# Patient Record
Sex: Female | Born: 1975 | Race: White | Hispanic: No | Marital: Married | Smoking: Former smoker
Health system: Southern US, Community
[De-identification: ages and names within clinical notes are randomized; demographics above are authoritative.]

## PROBLEM LIST (undated history)

## (undated) DIAGNOSIS — G43909 Migraine, unspecified, not intractable, without status migrainosus: Secondary | ICD-10-CM

## (undated) DIAGNOSIS — J309 Allergic rhinitis, unspecified: Secondary | ICD-10-CM

## (undated) DIAGNOSIS — F419 Anxiety disorder, unspecified: Secondary | ICD-10-CM

## (undated) HISTORY — DX: Migraine, unspecified, not intractable, without status migrainosus: G43.909

## (undated) HISTORY — DX: Anxiety disorder, unspecified: F41.9

## (undated) HISTORY — DX: Allergic rhinitis, unspecified: J30.9

---

## 2004-12-05 ENCOUNTER — Ambulatory Visit: Payer: Self-pay | Admitting: Internal Medicine

## 2005-03-23 ENCOUNTER — Ambulatory Visit: Payer: Self-pay | Admitting: Internal Medicine

## 2005-06-05 ENCOUNTER — Emergency Department (HOSPITAL_COMMUNITY): Admission: EM | Admit: 2005-06-05 | Discharge: 2005-06-05 | Payer: Self-pay | Admitting: Emergency Medicine

## 2006-12-28 ENCOUNTER — Ambulatory Visit: Payer: Self-pay | Admitting: Internal Medicine

## 2007-05-09 ENCOUNTER — Ambulatory Visit: Payer: Self-pay | Admitting: Internal Medicine

## 2007-12-05 ENCOUNTER — Ambulatory Visit: Payer: Self-pay | Admitting: Internal Medicine

## 2007-12-05 DIAGNOSIS — J45998 Other asthma: Secondary | ICD-10-CM

## 2007-12-05 DIAGNOSIS — F6389 Other impulse disorders: Secondary | ICD-10-CM | POA: Insufficient documentation

## 2007-12-05 DIAGNOSIS — J302 Other seasonal allergic rhinitis: Secondary | ICD-10-CM

## 2007-12-05 DIAGNOSIS — J3089 Other allergic rhinitis: Secondary | ICD-10-CM

## 2008-01-09 ENCOUNTER — Encounter: Payer: Self-pay | Admitting: Internal Medicine

## 2008-02-27 ENCOUNTER — Ambulatory Visit: Payer: Self-pay | Admitting: Internal Medicine

## 2008-02-29 DIAGNOSIS — F411 Generalized anxiety disorder: Secondary | ICD-10-CM | POA: Insufficient documentation

## 2008-08-29 ENCOUNTER — Telehealth: Payer: Self-pay | Admitting: Internal Medicine

## 2008-09-01 ENCOUNTER — Telehealth (INDEPENDENT_AMBULATORY_CARE_PROVIDER_SITE_OTHER): Payer: Self-pay | Admitting: *Deleted

## 2009-02-25 ENCOUNTER — Ambulatory Visit: Payer: Self-pay | Admitting: Internal Medicine

## 2009-02-26 LAB — CONVERTED CEMR LAB
ALT: 15 units/L (ref 0–35)
AST: 20 units/L (ref 0–37)
Alkaline Phosphatase: 62 units/L (ref 39–117)
BUN: 13 mg/dL (ref 6–23)
Basophils Relative: 0.9 % (ref 0.0–3.0)
Bilirubin Urine: NEGATIVE
Bilirubin, Direct: 0.2 mg/dL (ref 0.0–0.3)
Cholesterol: 146 mg/dL (ref 0–200)
Eosinophils Relative: 6 % — ABNORMAL HIGH (ref 0.0–5.0)
GFR calc Af Amer: 108 mL/min
GFR calc non Af Amer: 90 mL/min
Glucose, Bld: 90 mg/dL (ref 70–99)
Ketones, ur: NEGATIVE mg/dL
LDL Cholesterol: 73 mg/dL (ref 0–99)
Leukocytes, UA: NEGATIVE
Lymphocytes Relative: 25.7 % (ref 12.0–46.0)
MCV: 95.4 fL (ref 78.0–100.0)
Monocytes Absolute: 0.6 10*3/uL (ref 0.1–1.0)
Neutro Abs: 5.3 10*3/uL (ref 1.4–7.7)
Nitrite: NEGATIVE
Platelets: 252 10*3/uL (ref 150–400)
RDW: 11.9 % (ref 11.5–14.6)
Sodium: 139 meq/L (ref 135–145)
Specific Gravity, Urine: >=1.03 (ref 1.000–1.035)
Total Bilirubin: 0.8 mg/dL (ref 0.3–1.2)
Total Protein: 7.1 g/dL (ref 6.0–8.3)
Urine Glucose: NEGATIVE mg/dL
pH: 5.5 (ref 5.0–8.0)

## 2009-05-20 ENCOUNTER — Telehealth: Payer: Self-pay | Admitting: Internal Medicine

## 2009-05-20 ENCOUNTER — Encounter: Payer: Self-pay | Admitting: Internal Medicine

## 2009-09-13 ENCOUNTER — Ambulatory Visit: Payer: Self-pay | Admitting: Internal Medicine

## 2009-09-13 DIAGNOSIS — J019 Acute sinusitis, unspecified: Secondary | ICD-10-CM

## 2009-12-02 ENCOUNTER — Ambulatory Visit: Payer: Self-pay | Admitting: Internal Medicine

## 2010-02-28 ENCOUNTER — Ambulatory Visit: Payer: Self-pay | Admitting: Internal Medicine

## 2010-02-28 DIAGNOSIS — J45909 Unspecified asthma, uncomplicated: Secondary | ICD-10-CM

## 2011-01-24 NOTE — Assessment & Plan Note (Signed)
Summary: FEVER,SORE THROAT,COUGH,CHEST CONGEST-STC   Vital Signs:  Patient profile:   35 year old female Height:      64 inches Weight:      158.50 pounds BMI:     27.30 O2 Sat:      98 % on Room air Temp:     98.7 degrees F oral Pulse rate:   95 / minute BP sitting:   100 / 70  (left arm) Cuff size:   regular  Vitals Entered ByZella Ball Ewing (February 28, 2010 1:28 PM)  O2 Flow:  Room air CC: Sore throat, cough, chest congestion/RE   Primary Care Provider:  Corwin Levins MD  CC:  Sore throat, cough, and chest congestion/RE.  History of Present Illness: here with 5 days acute onset fever, malaise, headache, myalgias, mild ST ;  also for last 2 days with increasing prod cough, greenish without blood, and marked increased wheezing, mild sob, hard to sleep last night, using proventil inh up to 6 times per day; Pt denies CP,  orthopnea, pnd, worsening LE edema, palps, dizziness or syncope .  No high fever or chills, rash or joint pains.  Asthma until recent has been well controlled on current meds, with only infreq inhaler use and no nighttime awaekneings.    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 1 By Mouth Once Daily 2)  Proventil Hfa 108 (90 Base) Mcg/act  Aers (Albuterol Sulfate) .... 2 Puffs Four Times Per Day As Needed 3)  Guaifenesin Dac 30-10-100 Mg/43ml Soln (Pseudoephedrine-Codeine-Gg) .... 5-10 Ml By Mouth Qid As Needed For Cough  Current Medications (verified): 1)  Singulair 10 Mg  Tabs (Montelukast Sodium) .Marland Kitchen.. 1 By Mouth Once Daily 2)  Proventil Hfa 108 (90 Base) Mcg/act  Aers  (Albuterol Sulfate) .... 2 Puffs Four Times Per Day As Needed 3)  Hydrocodone-Homatropine 5-1.5 Mg/62ml Syrp (Hydrocodone-Homatropine) .Marland Kitchen.. 1 Tsp By Mouth Q 6 Hrs As Needed Cough 4)  Azithromycin 250 Mg Tabs (Azithromycin) .... 2po Qd For 1 Day, Then 1po Qd For 4days, Then Stop 5)  Prednisone 10 Mg Tabs (Prednisone) .... 4po Qd For 3days, Then 3po Qd For 3days, Then 2po Qd For 3days, Then 1po Qd For 3 Days, Then Stop  Allergies (verified): 1)  ! Neosporin  Past History:  Past Medical History: Last updated: 02/27/2008 Asthma Allergic rhinitis migraine Anxiety  Past Surgical History: Last updated: 12/05/2007 s/p wrist surgury  Social History: Last updated: 12/02/2009 Current Smoker Alcohol use-yes Occupation: Physicist, medical Alcohol use-no Drug use-no Regular exercise-yes  Risk Factors: Alcohol Use: 0 (12/02/2009) Exercise: yes (12/02/2009)  Risk Factors: Smoking Status: current (12/02/2009) Packs/Day: 0.5 (12/02/2009)  Review of Systems       all otherwise negative per pt -   Physical Exam  General:  alert and overweight-appearing.  , mild ill  Head:  normocephalic and atraumatic.   Eyes:  vision grossly intact, pupils equal, and pupils round.   Ears:  bilat tm's  mild red, sinus nontender Nose:  nasal dischargemucosal pallor and mucosal erythema.   Mouth:  pharyngeal erythema and fair dentition.  Neck:  supple and cervical lymphadenopathy.   Lungs:  normal respiratory effort, R decreased breath sounds, R wheezes, L fremitus, and L wheezes.   Heart:  normal rate and regular rhythm.   Extremities:  no edema, no erythema  Skin:  color normal and no rashes.     Impression & Recommendations:  Problem # 1:  BRONCHITIS-ACUTE (ICD-466.0)  Her updated medication list for this problem includes:    Singulair 10 Mg Tabs (Montelukast sodium) .Marland Kitchen... 1 by mouth once daily    Proventil Hfa 108 (90 Base) Mcg/act Aers (Albuterol sulfate) .Marland Kitchen... 2 puffs four times per day as  needed    Hydrocodone-homatropine 5-1.5 Mg/73ml Syrp (Hydrocodone-homatropine) .Marland Kitchen... 1 tsp by mouth q 6 hrs as needed cough    Azithromycin 250 Mg Tabs (Azithromycin) .Marland Kitchen... 2po qd for 1 day, then 1po qd for 4days, then stop treat as above, f/u any worsening signs or symptoms   Problem # 2:  WHEEZING (ICD-786.07) likely due to above, for depomedrol IM todya, and prednisone burst adn taper off   Problem # 3:  ASTHMA (ICD-493.90)  Her updated medication list for this problem includes:    Singulair 10 Mg Tabs (Montelukast sodium) .Marland Kitchen... 1 by mouth once daily    Proventil Hfa 108 (90 Base) Mcg/act Aers (Albuterol sulfate) .Marland Kitchen... 2 puffs four times per day as needed    Prednisone 10 Mg Tabs (Prednisone) .Marland KitchenMarland KitchenMarland KitchenMarland Kitchen 4po qd for 3days, then 3po qd for 3days, then 2po qd for 3days, then 1po qd for 3 days, then stop o/w has been stable for many months, cont chronic tx as above  Complete Medication List: 1)  Singulair 10 Mg Tabs (Montelukast sodium) .Marland Kitchen.. 1 by mouth once daily 2)  Proventil Hfa 108 (90 Base) Mcg/act Aers (Albuterol sulfate) .... 2 puffs four times per day as needed 3)  Hydrocodone-homatropine 5-1.5 Mg/73ml Syrp (Hydrocodone-homatropine) .Marland Kitchen.. 1 tsp by mouth q 6 hrs as needed cough 4)  Azithromycin 250 Mg Tabs (Azithromycin) .... 2po qd for 1 day, then 1po qd for 4days, then stop 5)  Prednisone 10 Mg Tabs (Prednisone) .... 4po qd for 3days, then 3po qd for 3days, then 2po qd for 3days, then 1po qd for 3 days, then stop  Patient Instructions: 1)  you had the steroid shot today 2)  Please take all new medications as prescribed  3)  Continue all previous medications as before this visit  4)  Please schedule a follow-up appointment in 1 year or sooner if needed Prescriptions: PREDNISONE 10 MG TABS (PREDNISONE) 4po qd for 3days, then 3po qd for 3days, then 2po qd for 3days, then 1po qd for 3 days, then stop  #30 x 0   Entered and Authorized by:   Corwin Levins MD   Signed by:   Corwin Levins MD on  02/28/2010   Method used:   Print then Give to Patient   RxID:   [id number] AZITHROMYCIN 250 MG TABS (AZITHROMYCIN) 2po qd for 1 day, then 1po qd for 4days, then stop  #6 x 1   Entered and Authorized by:   Corwin Levins MD   Signed by:   Corwin Levins MD on 02/28/2010   Method used:   Print then Give to Patient   RxID:   [id number] HYDROCODONE-HOMATROPINE 5-1.5 MG/5ML SYRP (HYDROCODONE-HOMATROPINE) 1 tsp by mouth q 6 hrs as needed cough  #6 oz x 1   Entered and Authorized by:   Corwin Levins MD   Signed by:  Corwin Levins MD on 02/28/2010   Method used:   Print then Give to Patient   RxID:   [id number] PROVENTIL HFA 108 (90 BASE) MCG/ACT  AERS (ALBUTEROL SULFATE) 2 puffs four times per day as needed  #3 x 3   Entered and Authorized by:   Corwin Levins MD   Signed by:   Corwin Levins MD on 02/28/2010   Method used:   Print then Give to Patient   RxID:   [id number] SINGULAIR 10 MG  TABS (MONTELUKAST SODIUM) 1 by mouth once daily  #90 x 3   Entered and Authorized by:   Corwin Levins MD   Signed by:   Corwin Levins MD on 02/28/2010   Method used:   Print then Give to Patient   RxID:   [id number]   Appended Document: Immunization Entry      Medication Administration  Injection # 1:    Medication: Depo- Medrol 40mg     Diagnosis: BRONCHITIS-ACUTE (ICD-466.0)    Route: IM    Site: LUOQ gluteus    Exp Date: 08/2012    Lot #: [id number]    Mfr: Pharmacia    Given by: Zella Ball Ewing (February 28, 2010 1:53 PM)  Injection # 2:    Medication: Depo- Medrol 80mg     Diagnosis: BRONCHITIS-ACUTE (ICD-466.0)    Route: IM    Site: LUOQ gluteus    Exp Date: 08/2012    Lot #: [id number]    Mfr: Pharmacia    Given by: Zella Ball Ewing (February 28, 2010 1:53 PM)  Orders Added: 1)  Depo- Medrol 40mg  [J1030] 2)  Depo- Medrol 80mg  [J1040] 3)  Admin of Therapeutic Inj  intramuscular or subcutaneous [[id number]]

## 2011-05-01 ENCOUNTER — Ambulatory Visit (INDEPENDENT_AMBULATORY_CARE_PROVIDER_SITE_OTHER): Payer: Private Health Insurance - Indemnity | Admitting: Internal Medicine

## 2011-05-01 ENCOUNTER — Telehealth: Payer: Self-pay | Admitting: *Deleted

## 2011-05-01 ENCOUNTER — Encounter: Payer: Self-pay | Admitting: Internal Medicine

## 2011-05-01 VITALS — BP 118/82 | HR 68 | Temp 99.1°F

## 2011-05-01 DIAGNOSIS — T6391XA Toxic effect of contact with unspecified venomous animal, accidental (unintentional), initial encounter: Secondary | ICD-10-CM

## 2011-05-01 DIAGNOSIS — T63461A Toxic effect of venom of wasps, accidental (unintentional), initial encounter: Secondary | ICD-10-CM

## 2011-05-01 DIAGNOSIS — T63441A Toxic effect of venom of bees, accidental (unintentional), initial encounter: Secondary | ICD-10-CM

## 2011-05-01 MED ORDER — PREDNISONE (PAK) 10 MG PO TABS: 10.0000 mg | ORAL_TABLET | ORAL | Status: AC

## 2011-05-01 NOTE — Progress Notes (Signed)
  Subjective:    Patient ID: Alexis Hodges, female    DOB: [birth year], 35 y.o.   MRN: [id number]  HPI complains of right 3rd toe itch and swelling Precipitated by bee sting last week, onset 4 days ago Pain at time of initial sting - removed stinger and pain improved after 1st 48h Now increase itch and swelling in toe No lip or tongue swelling - no stridor or SOB or wheeze Improved with topical steroid cream otc but only minimally  Past Medical History  Diagnosis Date  . Asthma   . Allergic rhinitis, cause unspecified   . Anxiety   . Migraine headache     Review of Systems  Constitutional: Negative for fever.  Respiratory: Negative for shortness of breath, wheezing and stridor.   Musculoskeletal: Negative for joint swelling and gait problem.  Hematological: Does not bruise/bleed easily.       Objective:   Physical Exam BP 118/82  Pulse 68  Temp(Src) 99.1 F (37.3 C) (Oral)  SpO2 98% Physical Exam  Constitutional: She is oriented to person, place, and time. She appears well-developed and well-nourished. No distress.  Cardiovascular: Normal rate, regular rhythm and normal heart sounds.  No murmur heard. Pulmonary/Chest: Effort normal and breath sounds normal. No respiratory distress. She has no wheezes.  Musculoskeletal: 3rd right toe with "sausage edema" erythema, not tender to touch - evidence of stinger envenomation wound on lateral edge.Normal range of motion. She exhibits no other edema of effusion.  Psychiatric: She has a normal mood and affect. Her behavior is normal. Judgment and thought content normal.   Lab Results  Component Value Date   WBC 8.8 02/25/2009   HGB 14.9 02/25/2009   HCT 42.2 02/25/2009   PLT 252 02/25/2009   CHOL 146 02/25/2009   TRIG 54 02/25/2009   HDL 61.9 02/25/2009   ALT 15 02/25/2009   AST 20 02/25/2009   NA 139 02/25/2009   K 4.6 02/25/2009   CL 104 02/25/2009   CREATININE 0.8 02/25/2009   BUN 13 02/25/2009   CO2 29 02/25/2009   TSH 2.45 02/25/2009         Assessment & Plan:  Bee sting with allergic reaction, localized edema and itch - no hx same but atopic profile tx pred pak and cont otc steroid topical as needed

## 2011-05-01 NOTE — Telephone Encounter (Signed)
Pt c/o insect bite ? Honey bee last week. It was red and swollen x 1 day then back to "normal". Early this am pt c/o swelling, itching and pain in same toe. Advised OV for eval and scheduled for Dr Felicity Coyer today.

## 2011-05-01 NOTE — Patient Instructions (Signed)
It was good to see you today. Pred pack for allergic reaction - Your prescription(s) have been submitted to your pharmacy. Please take as directed and contact our office if you believe you are having problem(s) with the medication(s). Please schedule followup as needed, consider annual physical and labs

## 2011-08-29 ENCOUNTER — Ambulatory Visit (INDEPENDENT_AMBULATORY_CARE_PROVIDER_SITE_OTHER)
Admission: RE | Admit: 2011-08-29 | Discharge: 2011-08-29 | Disposition: A | Discharge status: Home / Self Care | Payer: Private Health Insurance - Indemnity | Source: Ambulatory Visit | Attending: Internal Medicine | Admitting: Internal Medicine

## 2011-08-29 ENCOUNTER — Encounter: Payer: Self-pay | Admitting: Internal Medicine

## 2011-08-29 ENCOUNTER — Ambulatory Visit (INDEPENDENT_AMBULATORY_CARE_PROVIDER_SITE_OTHER): Payer: Private Health Insurance - Indemnity | Admitting: Internal Medicine

## 2011-08-29 VITALS — BP 128/88 | HR 91 | Temp 98.8°F | Ht 64.0 in | Wt 149.5 lb

## 2011-08-29 DIAGNOSIS — J209 Acute bronchitis, unspecified: Secondary | ICD-10-CM

## 2011-08-29 DIAGNOSIS — F411 Generalized anxiety disorder: Secondary | ICD-10-CM

## 2011-08-29 DIAGNOSIS — J45909 Unspecified asthma, uncomplicated: Secondary | ICD-10-CM

## 2011-08-29 DIAGNOSIS — Z0000 Encounter for general adult medical examination without abnormal findings: Secondary | ICD-10-CM | POA: Insufficient documentation

## 2011-08-29 DIAGNOSIS — R062 Wheezing: Secondary | ICD-10-CM

## 2011-08-29 MED ORDER — METHYLPREDNISOLONE ACETATE 80 MG/ML IJ SUSP
120.0000 mg | Freq: Once | INTRAMUSCULAR | Status: AC
  Administered 2011-08-29: 120 mg via INTRAMUSCULAR

## 2011-08-29 MED ORDER — ALBUTEROL SULFATE HFA 108 (90 BASE) MCG/ACT IN AERS: 2.0000 | INHALATION_SPRAY | Freq: Four times a day (QID) | RESPIRATORY_TRACT | Status: DC | PRN

## 2011-08-29 MED ORDER — PREDNISONE 10 MG PO TABS: ORAL_TABLET | ORAL | Status: DC

## 2011-08-29 MED ORDER — MONTELUKAST SODIUM 10 MG PO TABS: 10.0000 mg | ORAL_TABLET | Freq: Every day | ORAL | Status: DC

## 2011-08-29 MED ORDER — LEVOFLOXACIN 500 MG PO TABS: 500.0000 mg | ORAL_TABLET | Freq: Every day | ORAL | Status: AC

## 2011-08-29 MED ORDER — HYDROCODONE-HOMATROPINE 5-1.5 MG/5ML PO SYRP: 5.0000 mL | ORAL_SOLUTION | Freq: Four times a day (QID) | ORAL | Status: AC | PRN

## 2011-08-29 NOTE — Assessment & Plan Note (Signed)
stable overall by hx and exam, , and pt to continue medical treatment as before   

## 2011-08-29 NOTE — Assessment & Plan Note (Signed)
stable overall by hx and exam, most recent data reviewed with pt, and pt to continue medical treatment as before  Lab Results  Component Value Date   WBC 8.8 02/25/2009   HGB 14.9 02/25/2009   HCT 42.2 02/25/2009   PLT 252 02/25/2009   CHOL 146 02/25/2009   TRIG 54 02/25/2009   HDL 61.9 02/25/2009   ALT 15 02/25/2009   AST 20 02/25/2009   NA 139 02/25/2009   K 4.6 02/25/2009   CL 104 02/25/2009   CREATININE 0.8 02/25/2009   BUN 13 02/25/2009   CO2 29 02/25/2009   TSH 2.45 02/25/2009

## 2011-08-29 NOTE — Assessment & Plan Note (Signed)
Mild to mod, for antibx course,  to f/u any worsening symptoms or concerns 

## 2011-08-29 NOTE — Progress Notes (Signed)
  Subjective:    Patient ID: Alexis Hodges, female    DOB: September 25, 1976, 35 y.o.   MRN: [id number]  HPI  Here with acute onset mild to mod 2-3 days ST, HA, general weakness and malaise, with prod cough greenish sputum, but Pt denies chest pain, increased sob or doe, wheezing, orthopnea, PND, increased LE swelling, palpitations, dizziness or syncope, except for onset wheezing x 2 days.  O/w no overt asthma symtpoms, or night time awakening with wheezing, sob., cough.  Pt denies new neurological symptoms such as new headache, or facial or extremity weakness or numbness   Pt denies polydipsia, polyuria.  Denies worsening depressive symptoms, suicidal ideation, or panic. Past Medical History  Diagnosis Date  . Asthma   . Allergic rhinitis, cause unspecified   . Anxiety   . Migraine headache    No past surgical history on file.  reports that she has been smoking.  She does not have any smokeless tobacco history on file. She reports that she drinks alcohol. She reports that she does not use illicit drugs. family history is not on file. Allergies  Allergen Reactions  . Triple Antibiotic    No current outpatient prescriptions on file prior to visit.   No current facility-administered medications on file prior to visit.   Review of Systems Review of Systems  Constitutional: Negative for diaphoresis and unexpected weight change.  HENT: Negative for drooling and tinnitus.   Eyes: Negative for photophobia and visual disturbance.  Respiratory: Negative for choking and stridor.   Gastrointestinal: Negative for vomiting and blood in stool.  Genitourinary: Negative for hematuria and decreased urine volume.     Objective:   Physical Exam BP 128/88  Pulse 91  Temp(Src) 98.8 F (37.1 C) (Oral)  Ht 5\' 4"  (1.626 m)  Wt 149 lb 8 oz (67.813 kg)  BMI 25.66 kg/m2  SpO2 98%  LMP 08/09/2011 Physical Exam  VS noted, mild ill Constitutional: Pt appears well-developed and well-nourished.  HENT: Head:  Normocephalic.  Right Ear: External ear normal.  Left Ear: External ear normal.  Bilat tm's mild erythema.  Sinus nontender.  Pharynx mild erythema Eyes: Conjunctivae and EOM are normal. Pupils are equal, round, and reactive to light.  Neck: Normal range of motion. Neck supple.  Cardiovascular: Normal rate and regular rhythm.   Pulmonary/Chest: Effort normal and breath sounds decresaed bilat with mild wheezes  Neurological: Pt is alert. No cranial nerve deficit.  Skin: Skin is warm. No erythema.  Psychiatric: Pt behavior is normal. Thought content normal. 1+ nervous        Assessment & Plan:

## 2011-08-29 NOTE — Assessment & Plan Note (Signed)
Mild to mod, for depomedrom and predpack,  to f/u any worsening symptoms or concerns

## 2011-08-29 NOTE — Patient Instructions (Signed)
You had the steroid shot today Take all new medications as prescribed Continue all other medications as before - all sent to the pharmacy Please go to XRAY in the Basement for the x-ray test Please call the phone number [phone] (the [employer] System) for results of testing in 2-3 days;  When calling, simply dial the number, and when prompted enter the MRN number above (the Medical Record Number) and the # key, then the message should start. Please return in 1 year for your yearly visit, or sooner if needed, with Lab testing done 3-5 days before

## 2012-03-04 ENCOUNTER — Telehealth: Payer: Self-pay | Admitting: *Deleted

## 2012-03-04 NOTE — Telephone Encounter (Signed)
Patient says she has been trying to get a prior approval for her asthma medication for a few months now.  The pharmacy says they have sent it but they are getting no response.  Patient states she needs her medication.

## 2012-03-06 NOTE — Telephone Encounter (Signed)
Called [facility] to start PA for Singulair.  Received approval from 03/06/12 through 03/07/2015.  Called pharmacy to inform and faxed approval letter to them as well. Called the patient left message of approval. Mailed a copy  Of approval letter to the patient to her home.

## 2012-05-10 ENCOUNTER — Other Ambulatory Visit: Payer: Self-pay

## 2012-05-10 MED ORDER — MONTELUKAST SODIUM 10 MG PO TABS: 10.0000 mg | ORAL_TABLET | Freq: Every day | ORAL | Status: DC

## 2012-09-02 ENCOUNTER — Other Ambulatory Visit: Payer: Self-pay

## 2012-09-02 MED ORDER — MONTELUKAST SODIUM 10 MG PO TABS: 10.0000 mg | ORAL_TABLET | Freq: Every day | ORAL | Status: DC

## 2012-09-04 ENCOUNTER — Encounter: Payer: Self-pay | Admitting: Internal Medicine

## 2012-09-04 ENCOUNTER — Ambulatory Visit (INDEPENDENT_AMBULATORY_CARE_PROVIDER_SITE_OTHER): Payer: Private Health Insurance - Indemnity | Admitting: Internal Medicine

## 2012-09-04 VITALS — BP 122/90 | HR 68 | Temp 98.6°F | Resp 16 | Wt 144.8 lb

## 2012-09-04 DIAGNOSIS — J45901 Unspecified asthma with (acute) exacerbation: Secondary | ICD-10-CM

## 2012-09-04 DIAGNOSIS — J309 Allergic rhinitis, unspecified: Secondary | ICD-10-CM

## 2012-09-04 DIAGNOSIS — J45909 Unspecified asthma, uncomplicated: Secondary | ICD-10-CM

## 2012-09-04 MED ORDER — FLUTICASONE-SALMETEROL 250-50 MCG/DOSE IN AEPB: 1.0000 | INHALATION_SPRAY | Freq: Two times a day (BID) | RESPIRATORY_TRACT | Status: DC

## 2012-09-04 MED ORDER — AZITHROMYCIN 250 MG PO TABS: ORAL_TABLET | ORAL | Status: AC

## 2012-09-04 MED ORDER — METHYLPREDNISOLONE ACETATE 80 MG/ML IJ SUSP
120.0000 mg | Freq: Once | INTRAMUSCULAR | Status: AC
  Administered 2012-09-04: 120 mg via INTRAMUSCULAR

## 2012-09-04 MED ORDER — FEXOFENADINE HCL 180 MG PO TABS: 90.0000 mg | ORAL_TABLET | Freq: Every day | ORAL | Status: DC

## 2012-09-04 NOTE — Progress Notes (Signed)
  Subjective:    Patient ID: Alexis Hodges, female    DOB: Dec 12, 1976, 36 y.o.   MRN: [id number]  HPI  complains of cough Onset 3 weeks ago - acute on chronic asthma/allergy symptoms  associated with chest congestion and increase allergy symptoms - sneeze, congestion Yellow discharge becoming freening Hx same with season change Taking OTC and rx meds without improvement in symptoms   Past Medical History  Diagnosis Date  . Asthma   . Allergic rhinitis, cause unspecified   . Anxiety   . Migraine headache     Review of Systems  Constitutional: Positive for fatigue. Negative for fever, chills and unexpected weight change.  HENT: Positive for congestion, rhinorrhea, sneezing, postnasal drip and sinus pressure. Negative for ear pain, neck pain and dental problem.   Respiratory: Positive for cough, chest tightness and shortness of breath. Negative for wheezing and stridor.   Cardiovascular: Negative for chest pain, palpitations and leg swelling.  Neurological: Negative for dizziness and headaches.       Objective:   Physical Exam BP 122/90  Pulse 68  Temp 98.6 F (37 C) (Oral)  Resp 16  Wt 144 lb 12 oz (65.658 kg)  SpO2 97% Wt Readings from Last 3 Encounters:  09/04/12 144 lb 12 oz (65.658 kg)  08/29/11 149 lb 8 oz (67.813 kg)  02/28/10 158 lb 8 oz (71.895 kg)   Constitutional: She appears well-developed and well-nourished. No distress.  HENT: Head: Normocephalic and atraumatic. Ears: B TMs ok, no erythema or effusion; Nose: L>R swollen turbinates- no purulence. Mouth/Throat: Oropharynx is clear and moist. No oropharyngeal exudate.  Eyes: Conjunctivae and EOM are normal. Pupils are equal, round, and reactive to light. No scleral icterus.  Neck: Normal range of motion. Neck supple. No JVD present. No thyromegaly present.  Cardiovascular: Normal rate, regular rhythm and normal heart sounds.  No murmur heard. No BLE edema. Pulmonary/Chest: Effort normal and breath sounds  normal, but dry coughing with inspiration. No respiratory distress. She has mild end exp wheeze.  Skin: Skin is warm and dry. No rash noted. No erythema.   Lab Results  Component Value Date   WBC 8.8 02/25/2009   HGB 14.9 02/25/2009   HCT 42.2 02/25/2009   PLT 252 02/25/2009   GLUCOSE 90 02/25/2009   CHOL 146 02/25/2009   TRIG 54 02/25/2009   HDL 61.9 02/25/2009   LDLCALC 73 02/25/2009   ALT 15 02/25/2009   AST 20 02/25/2009   NA 139 02/25/2009   K 4.6 02/25/2009   CL 104 02/25/2009   CREATININE 0.8 02/25/2009   BUN 13 02/25/2009   CO2 29 02/25/2009   TSH 2.45 02/25/2009       Assessment & Plan:   Acute asthma bronchitis - Acute on chronic allergic rhinitis, seasonal flare with daily symptoms   IM Medrol today Add one month Advair 250/50 twice a day to rescue albuterol MDI when necessary Empiric Z-Pak Over-the-counter antihistamine to continue with Singulair Refer to allergy for further evaluation

## 2012-09-04 NOTE — Patient Instructions (Signed)
It was good to see you today. Medrol shot for allergy asthma flare given today If you develop worsening symptoms or fever, we can reconsider antibiotics (Z-Pak submitted to your pharmacy to use if symptoms worse), but it does not appear necessary to use antibiotics at this time. Add Allegra half tablet to ongoing Singulair Use Advair twice daily for next one month in addition to ongoing albuterol inhaler when needed we'll make referral to allergist for further testing and evaluation. Our office will contact you regarding appointment(s) once made.

## 2012-09-07 ENCOUNTER — Other Ambulatory Visit: Payer: Self-pay | Admitting: Internal Medicine

## 2012-09-27 IMAGING — CR DG CHEST 2V
2 series · 2 of 2 positions shown · non-contrast
Comparison: 09/13/2009

CLINICAL DATA: Fever, cough, and bilateral wheezes.  Bronchitis.

CHEST - 2 VIEW

[view not recorded (1 of 2)]
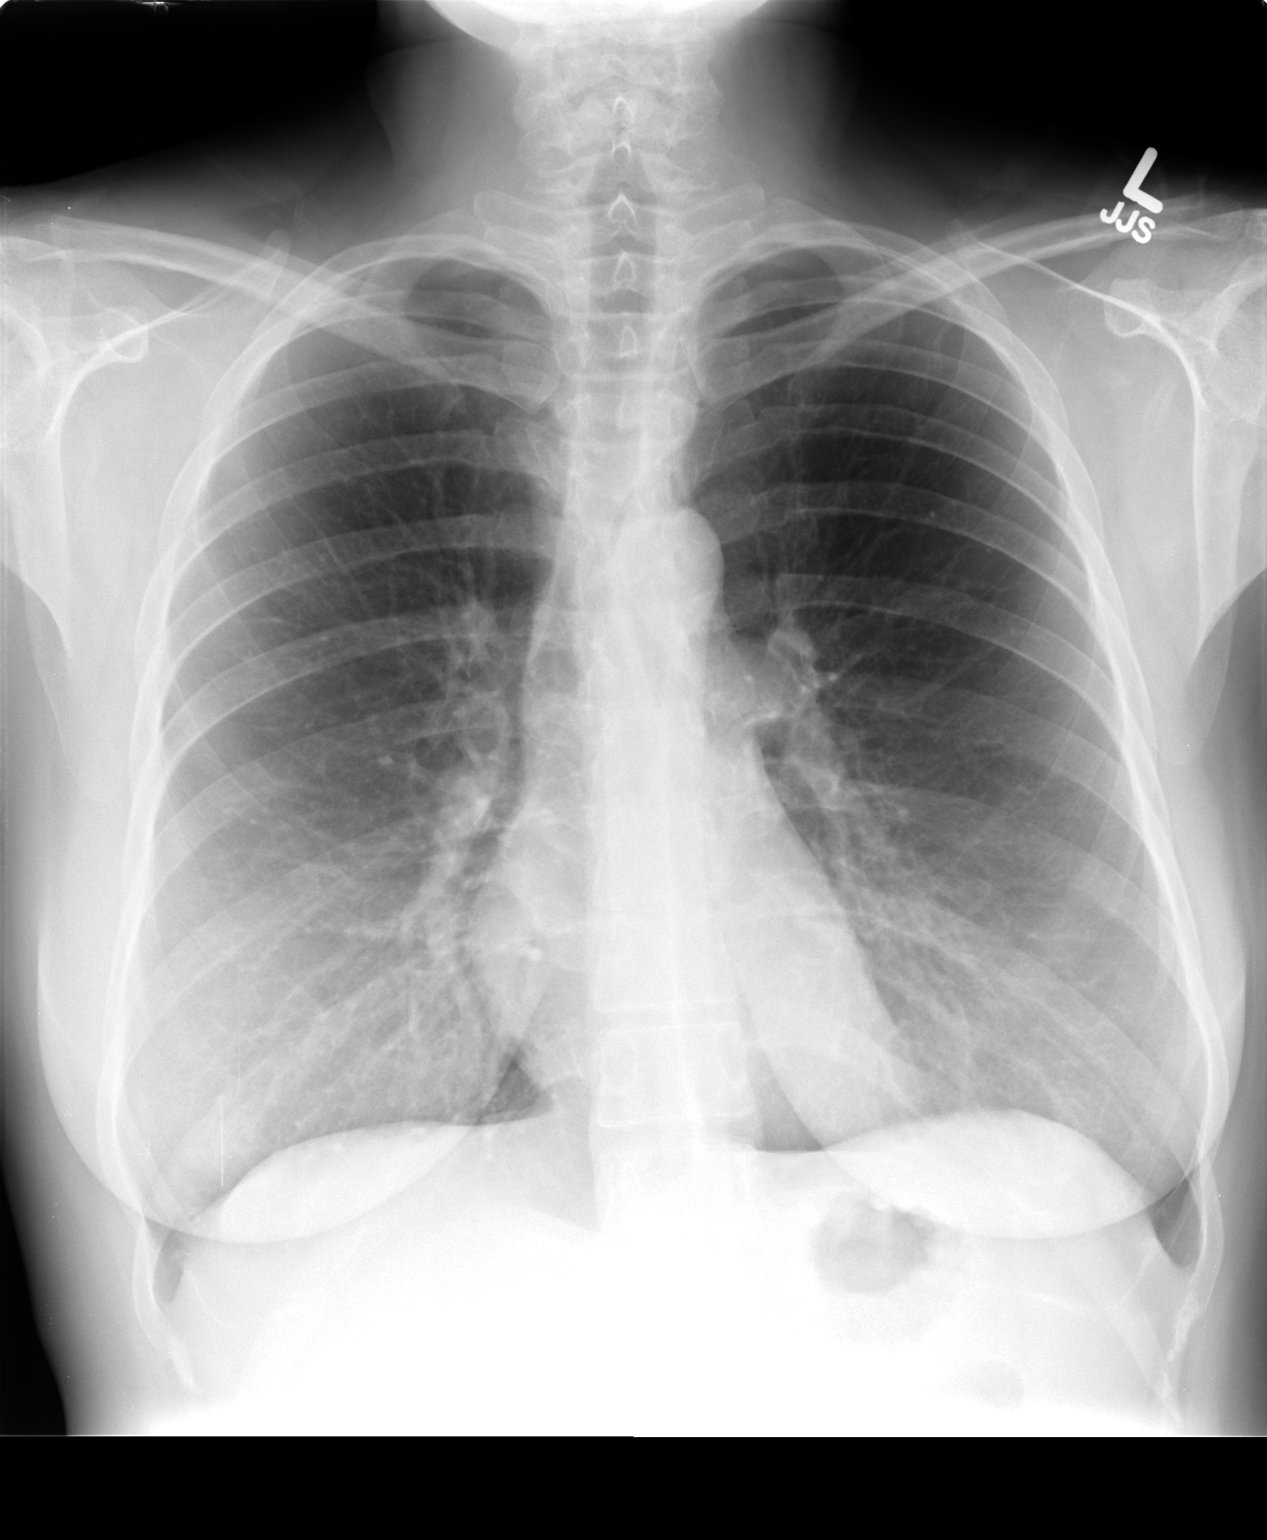

[view not recorded (2 of 2)]
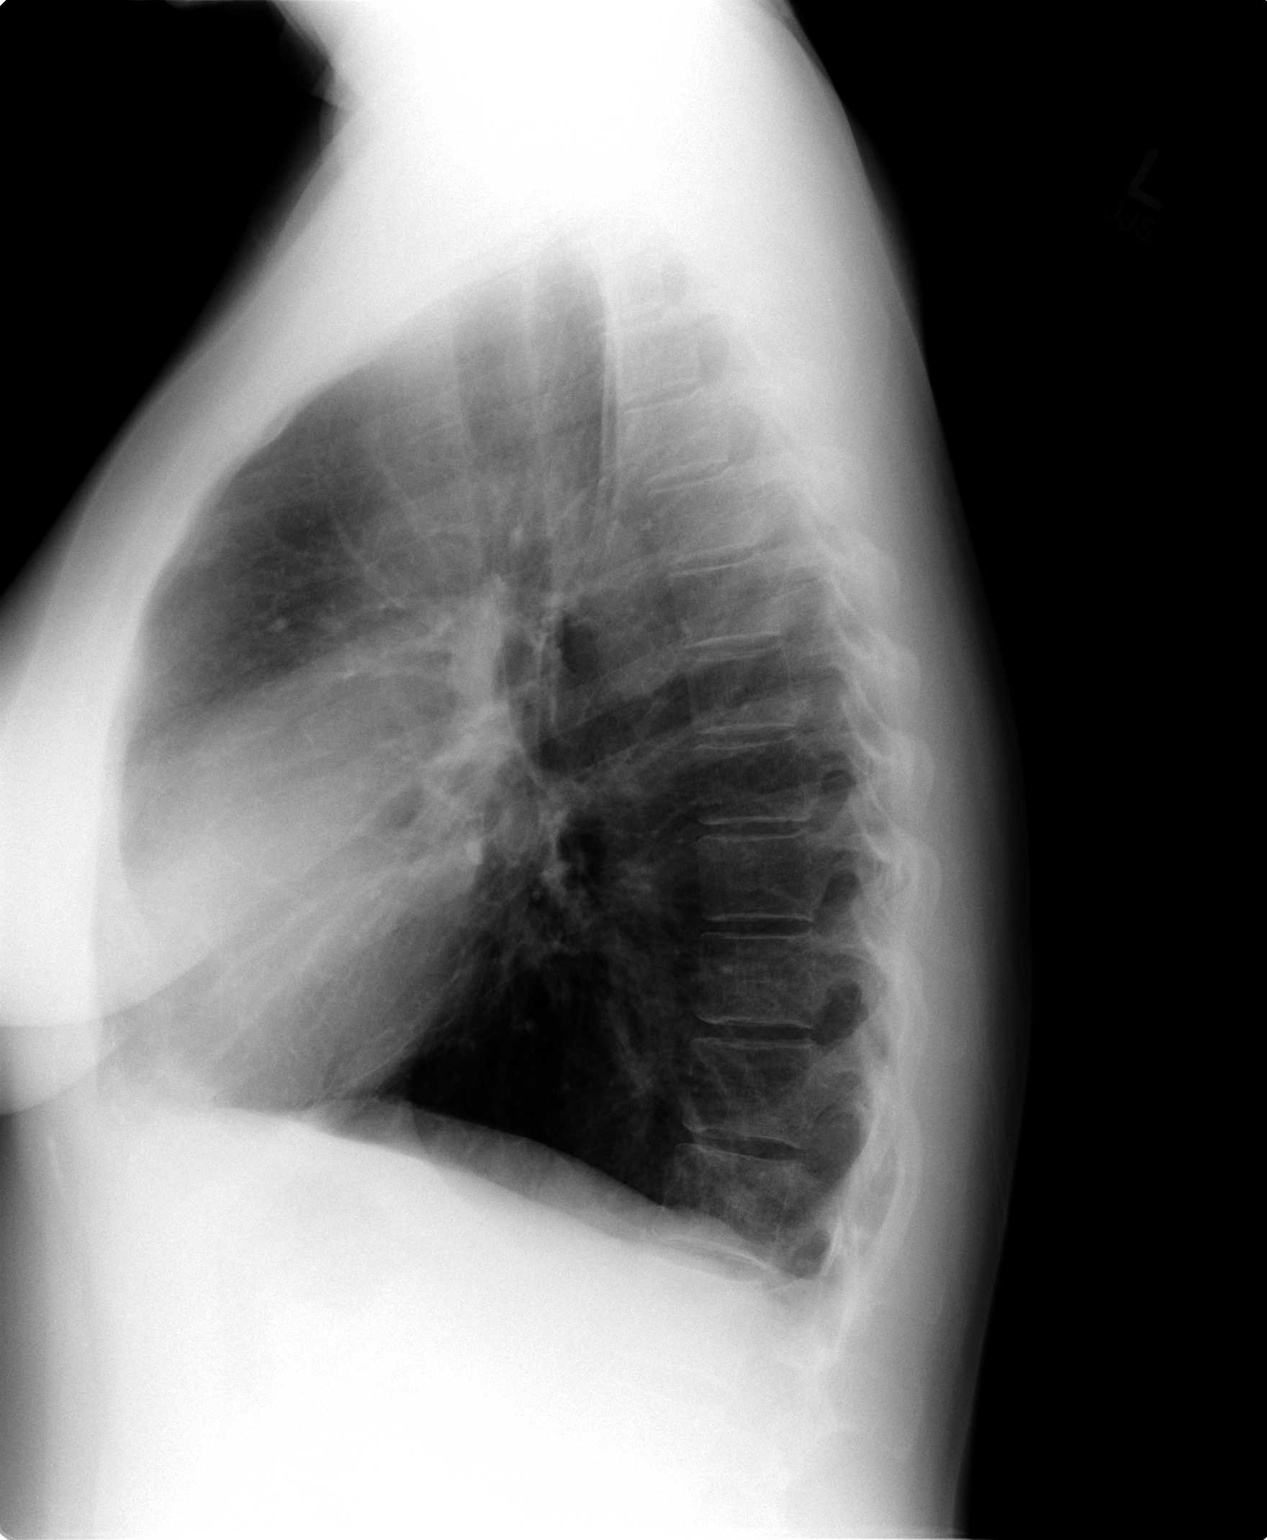

[2 of 2 positions shown; findings below may reference images not displayed]

FINDINGS: The heart size and vascularity are normal.  There is
slight peribronchial thickening consistent with bronchitis.  Lungs
are otherwise clear.  No effusions.  No osseous abnormality.
IMPRESSION: Mild bronchitic changes.

## 2012-10-18 ENCOUNTER — Encounter: Payer: Self-pay | Admitting: Internal Medicine

## 2012-10-18 ENCOUNTER — Other Ambulatory Visit: Payer: Private Health Insurance - Indemnity

## 2012-10-18 ENCOUNTER — Ambulatory Visit (INDEPENDENT_AMBULATORY_CARE_PROVIDER_SITE_OTHER): Payer: Private Health Insurance - Indemnity | Admitting: Internal Medicine

## 2012-10-18 VITALS — BP 102/64 | HR 83 | Ht 64.0 in | Wt 143.8 lb

## 2012-10-18 DIAGNOSIS — J45909 Unspecified asthma, uncomplicated: Secondary | ICD-10-CM

## 2012-10-18 DIAGNOSIS — Z889 Allergy status to unspecified drugs, medicaments and biological substances status: Secondary | ICD-10-CM

## 2012-10-18 DIAGNOSIS — J45998 Other asthma: Secondary | ICD-10-CM

## 2012-10-18 DIAGNOSIS — J302 Other seasonal allergic rhinitis: Secondary | ICD-10-CM

## 2012-10-18 DIAGNOSIS — J309 Allergic rhinitis, unspecified: Secondary | ICD-10-CM

## 2012-10-18 DIAGNOSIS — Z9109 Other allergy status, other than to drugs and biological substances: Secondary | ICD-10-CM

## 2012-10-18 MED ORDER — MOMETASONE FURO-FORMOTEROL FUM 100-5 MCG/ACT IN AERO: 2.0000 | INHALATION_SPRAY | Freq: Two times a day (BID) | RESPIRATORY_TRACT | Status: DC

## 2012-10-18 MED ORDER — ALBUTEROL SULFATE HFA 108 (90 BASE) MCG/ACT IN AERS: 2.0000 | INHALATION_SPRAY | RESPIRATORY_TRACT | Status: DC | PRN

## 2012-10-18 NOTE — Progress Notes (Signed)
10/18/12- 22 yoF smoker referred courtesy of Dr Felicity Coyer for allergy evaluation, with hx of allergic rhinitis, asthma. She admits to smoking one or 2 cigarettes "some days". She gives a history of perennial rhinitis symptoms with postnasal drip since childhood. Triggers have included house dust, fall leaves, perfumes. Grass causes hives Now worse since early September. Using her rescue inhaler more than one time per day. Usually asthma is controlled with just Singulair. Never emergency room for asthma. Has never felt allergies were dangerous and has not had an EpiPen. House with basement, pet rabbit, husband smokes and she smokes occasionally. No mold.  Prior to Admission medications   Medication Sig Start Date End Date Taking? Authorizing Provider  albuterol (PROVENTIL HFA) 108 (90 BASE) MCG/ACT inhaler Inhale 2 puffs into the lungs every 4 (four) hours as needed for wheezing or shortness of breath. 10/18/12 10/18/13 Yes Raygen Dahm D Durand Wittmeyer, MD  fexofenadine (ALLEGRA) 180 MG tablet Take 0.5 tablets (90 mg total) by mouth daily. 09/04/12 09/04/13 Yes Newt Lukes, MD  montelukast (SINGULAIR) 10 MG tablet TAKE 1 TABLET BY MOUTH AT BEDTIME 09/07/12  Yes Corwin Levins, MD  mometasone-formoterol [employer]) 100-5 MCG/ACT AERO Inhale 2 puffs into the lungs 2 (two) times daily. 10/18/12   Waymon Budge, MD   Past Medical History  Diagnosis Date  . Asthma   . Allergic rhinitis, cause unspecified   . Anxiety   . Migraine headache    History reviewed. No pertinent past surgical history. History reviewed. No pertinent family history. History   Social History  . Marital Status: Married    Spouse Name: N/A    Number of Children: N/A  . Years of Education: N/A   Occupational History  . Not on file.   Social History Main Topics  . Smoking status: Current Some Day Smoker -- 0.2 packs/day for 15 years    Types: Cigarettes  . Smokeless tobacco: Not on file  . Alcohol Use: Yes  . Drug Use: No  .  Sexually Active: Not on file   Other Topics Concern  . Not on file   Social History Narrative  . No narrative on file   ROS-see HPI Constitutional:   No-   weight loss, night sweats, fevers, chills, fatigue, lassitude. HEENT:   No-  headaches, difficulty swallowing, tooth/dental problems, sore throat,       + sneezing, itching, ear ache, nasal congestion, post nasal drip,  CV:  No-   chest pain, orthopnea, PND, swelling in lower extremities, anasarca,  dizziness, palpitations Resp: No-   shortness of breath with exertion or at rest.              No-   productive cough,  No non-productive cough,  No- coughing up of blood.              No-   change in color of mucus.  + wheezing.   Skin:  + hives GI:  No-   heartburn, indigestion, abdominal pain, nausea, vomiting, diarrhea,                 change in bowel habits, loss of appetite GU: No-   dysuria, change in color of urine, no urgency or frequency.  No- flank pain. MS:  No-   joint pain or swelling.  No- decreased range of motion.  No- back pain. Neuro-     nothing unusual Psych:  No- change in mood or affect. No depression or anxiety.  No memory loss.  OBJ- Physical Exam General- Alert, Oriented, Affect-appropriate, Distress- none acute Skin- rash-none, lesions- none, excoriation- none Lymphadenopathy- none Head- atraumatic            Eyes- Gross vision intact, PERRLA, conjunctivae and secretions clear            Ears- Hearing, canals-normal            Nose- Clear, no-Septal dev, mucus, polyps, erosion, perforation             Throat- Mallampati II , mucosa clear , drainage- none, tonsils- atrophic. + Tongue stud Neck- flexible , trachea midline, no stridor , thyroid nl, carotid no bruit Chest - symmetrical excursion , unlabored           Heart/CV- RRR , no murmur , no gallop  , no rub, nl s1 s2                           - JVD- none , edema- none, stasis changes- none, varices- none           Lung- clear to P&A, + minor wheeze, +  dry cough , dullness-none, rub- none           Chest wall-  Abd- tender-no, distended-no, bowel sounds-present, HSM- no Br/ Gen/ Rectal- Not done, not indicated Extrem- cyanosis- none, clubbing, none, atrophy- none, strength- nl Neuro- grossly intact to observation

## 2012-10-18 NOTE — Patient Instructions (Addendum)
Sample Dulera 100    2 puffs then rinse mouth well, twice every day   This is a maintenance controller to try as an alternative to Advair  Ok to continue Singulair, and to use your rescue inhaler if needed  Consider the environmental dust and pollen precaution information I gave you.  Nothing will help your airways as much as stopping all smoking- your own and your husband's.  Order- Schedule PFT dx asthma  Order- lab- Allergy profile  Script to refill your rescue inhaler    ADD ON LABS: (labs called and added original requisition through [facility] on 10-18-12) [facility]   Rabbit Epithelium IgE [id number] Rabbit Hair IgE  [id number]

## 2012-11-01 NOTE — Assessment & Plan Note (Signed)
She recognizes symptoms with some environmental exposures. Plan-allergy profile. Initial education about environmental precautions and use of antihistamines.

## 2012-11-01 NOTE — Assessment & Plan Note (Addendum)
Concerning cigarette smoke and pet rabbit indoors. Advair was too expensive. Plan-emphasis on smoking cessation. Rescue inhaler

## 2012-11-11 ENCOUNTER — Ambulatory Visit (INDEPENDENT_AMBULATORY_CARE_PROVIDER_SITE_OTHER): Payer: Private Health Insurance - Indemnity | Admitting: Internal Medicine

## 2012-11-11 DIAGNOSIS — J45909 Unspecified asthma, uncomplicated: Secondary | ICD-10-CM

## 2012-11-11 LAB — PULMONARY FUNCTION TEST

## 2012-11-11 NOTE — Progress Notes (Signed)
PFT done today. 

## 2012-12-04 ENCOUNTER — Encounter: Payer: Self-pay | Admitting: Internal Medicine

## 2012-12-04 ENCOUNTER — Ambulatory Visit (INDEPENDENT_AMBULATORY_CARE_PROVIDER_SITE_OTHER): Payer: Private Health Insurance - Indemnity | Admitting: Internal Medicine

## 2012-12-04 VITALS — BP 120/80 | HR 101 | Ht 64.0 in | Wt 146.8 lb

## 2012-12-04 DIAGNOSIS — J309 Allergic rhinitis, unspecified: Secondary | ICD-10-CM

## 2012-12-04 DIAGNOSIS — J45909 Unspecified asthma, uncomplicated: Secondary | ICD-10-CM

## 2012-12-04 DIAGNOSIS — J302 Other seasonal allergic rhinitis: Secondary | ICD-10-CM

## 2012-12-04 DIAGNOSIS — J45998 Other asthma: Secondary | ICD-10-CM

## 2012-12-04 MED ORDER — MOMETASONE FURO-FORMOTEROL FUM 100-5 MCG/ACT IN AERO: INHALATION_SPRAY | RESPIRATORY_TRACT | Status: DC

## 2012-12-04 MED ORDER — AZELASTINE-FLUTICASONE 137-50 MCG/ACT NA SUSP: 2.0000 | Freq: Every day | NASAL | Status: DC

## 2012-12-04 NOTE — Progress Notes (Signed)
10/18/12- 61 yoF smoker referred courtesy of Dr Felicity Coyer for allergy evaluation, with hx of allergic rhinitis, asthma. She admits to smoking one or 2 cigarettes "some days". She gives a history of perennial rhinitis symptoms with postnasal drip since childhood. Triggers have included house dust, fall leaves, perfumes. Grass causes hives Now worse since early September. Using her rescue inhaler more than one time per day. Usually asthma is controlled with just Singulair. Never emergency room for asthma. Has never felt allergies were dangerous and has not had an EpiPen. House with basement, pet rabbit, husband smokes and she smokes occasionally. No mold.  12/04/12- 67 yoF smoker referred courtesy of Dr Felicity Coyer for allergy evaluation, with hx of allergic rhinitis, asthma. FOLLOWS FOR: allergies are better than in the fall; never goes away. Asthma is controlled with Dulera, one puff once daily. Continue Singulair. Rescue inhaler once or twice a week. Allegra if needed. Allergy Profile-10/18/2012-total IgE 1393 with significant elevations for most allergens on the panel. This is expected with a total IgE is high. Particular elevation for cat. Rabbit was also elevated and she still has a rabbit in the house. I recommended it be removed. PFT: 11/11/2012-moderate obstructive airways disease with response to bronchodilator. Normal lung volumes, normal diffusion. FEV1 3.06/105%, FEV1/FVC 0.65. Significant response to bronchodilator. TLC 112%, DLCO 101%.  ROS-see HPI Constitutional:   No-   weight loss, night sweats, fevers, chills, fatigue, lassitude. HEENT:   No-  headaches, difficulty swallowing, tooth/dental problems, sore throat,       + sneezing, itching, ear ache, nasal congestion, post nasal drip,  CV:  No-   chest pain, orthopnea, PND, swelling in lower extremities, anasarca,  dizziness, palpitations Resp: No-   shortness of breath with exertion or at rest.              No-   productive cough,  No  non-productive cough,  No- coughing up of blood.              No-   change in color of mucus.  + wheezing.   Skin:  + hives GI:  No-   heartburn, indigestion, abdominal pain, nausea, vomiting, GU:  MS:  No-   joint pain or swelling.   Neuro-     nothing unusual Psych:  No- change in mood or affect. No depression or anxiety.  No memory loss.  OBJ- Physical Exam General- Alert, Oriented, Affect-appropriate, Distress- none acute Skin- rash-none, lesions- none, excoriation- none Lymphadenopathy- none Head- atraumatic            Eyes- Gross vision intact, PERRLA, conjunctivae and secretions clear            Ears- Hearing, canals-normal            Nose- + wet sniffing, no-Septal dev, mucus, polyps, erosion, perforation             Throat- Mallampati II , mucosa clear , drainage- none, tonsils- atrophic. + Tongue stud Neck- flexible , trachea midline, no stridor , thyroid nl, carotid no bruit Chest - symmetrical excursion , unlabored           Heart/CV- RRR , no murmur , no gallop  , no rub, nl s1 s2                           - JVD- none , edema- none, stasis changes- none, varices- none  Lung- clear to P&A, no- wheeze, no- cough , dullness-none, rub- none           Chest wall-  Abd-  Br/ Gen/ Rectal- Not done, not indicated Extrem- cyanosis- none, clubbing, none, atrophy- none, strength- nl Neuro- grossly intact to observation

## 2012-12-04 NOTE — Patient Instructions (Addendum)
Script sent for [facility] maintnenance inhaler  Sample Dymista nasal spray-   Try 1-2 puffs each nostril once every day at bedtime. Can be used two times daily if needed  Please call as needed

## 2012-12-12 ENCOUNTER — Ambulatory Visit (INDEPENDENT_AMBULATORY_CARE_PROVIDER_SITE_OTHER)
Admission: RE | Admit: 2012-12-12 | Discharge: 2012-12-12 | Disposition: A | Discharge status: Home / Self Care | Payer: Private Health Insurance - Indemnity | Source: Ambulatory Visit | Attending: Internal Medicine | Admitting: Internal Medicine

## 2012-12-12 ENCOUNTER — Encounter: Payer: Self-pay | Admitting: Internal Medicine

## 2012-12-12 ENCOUNTER — Other Ambulatory Visit (INDEPENDENT_AMBULATORY_CARE_PROVIDER_SITE_OTHER): Payer: Private Health Insurance - Indemnity

## 2012-12-12 ENCOUNTER — Ambulatory Visit (INDEPENDENT_AMBULATORY_CARE_PROVIDER_SITE_OTHER): Payer: Private Health Insurance - Indemnity | Admitting: Internal Medicine

## 2012-12-12 VITALS — BP 118/82 | HR 94 | Temp 98.3°F | Ht 64.0 in | Wt 145.0 lb

## 2012-12-12 DIAGNOSIS — R197 Diarrhea, unspecified: Secondary | ICD-10-CM

## 2012-12-12 DIAGNOSIS — R109 Unspecified abdominal pain: Secondary | ICD-10-CM

## 2012-12-12 DIAGNOSIS — R112 Nausea with vomiting, unspecified: Secondary | ICD-10-CM

## 2012-12-12 LAB — CBC WITH DIFFERENTIAL/PLATELET
Eosinophils Relative: 2.7 % (ref 0.0–5.0)
HCT: 40 % (ref 36.0–46.0)
Hemoglobin: 14 g/dL (ref 12.0–15.0)
Lymphocytes Relative: 18.9 % (ref 12.0–46.0)
Lymphs Abs: 2 10*3/uL (ref 0.7–4.0)
Monocytes Relative: 8 % (ref 3.0–12.0)
Neutro Abs: 7.4 10*3/uL (ref 1.4–7.7)
RBC: 4.01 Mil/uL (ref 3.87–5.11)
WBC: 10.5 10*3/uL (ref 4.5–10.5)

## 2012-12-12 LAB — URINALYSIS, ROUTINE W REFLEX MICROSCOPIC
Bilirubin Urine: NEGATIVE
Ketones, ur: NEGATIVE
Leukocytes, UA: NEGATIVE
Specific Gravity, Urine: >=1.03 (ref 1.000–1.030)
Urobilinogen, UA: 0.2 (ref 0.0–1.0)

## 2012-12-12 MED ORDER — ONDANSETRON HCL 4 MG PO TABS: 4.0000 mg | ORAL_TABLET | Freq: Three times a day (TID) | ORAL | Status: DC | PRN

## 2012-12-12 MED ORDER — DIPHENOXYLATE-ATROPINE 2.5-0.025 MG PO TABS: 1.0000 | ORAL_TABLET | Freq: Four times a day (QID) | ORAL | Status: DC | PRN

## 2012-12-12 MED ORDER — OXYCODONE HCL 5 MG PO TABS: 5.0000 mg | ORAL_TABLET | ORAL | Status: DC | PRN

## 2012-12-12 NOTE — Patient Instructions (Addendum)
Take all new medications as prescribed Continue all other medications as before Please go to XRAY in the Basement for the x-ray test Please go to LAB in the Basement for the blood and/or urine tests to be done today You will be contacted by phone if any changes need to be made immediately.  Otherwise, you will receive a letter about your results with an explanation, but please check with MyChart first. Thank you for enrolling in MyChart. Please follow the instructions below to securely access your online medical record. MyChart allows you to send messages to your doctor, view your test results, renew your prescriptions, schedule appointments, and more.

## 2012-12-12 NOTE — Assessment & Plan Note (Signed)
Plan-she currently feels comfortable. We will watch this spring season comes. She would be an appropriate candidate for skin testing and allergy vaccine. Sample Dymista nasal spray

## 2012-12-12 NOTE — Progress Notes (Signed)
Subjective:    Patient ID: Alexis Hodges, female    DOB: 1976-08-29, 36 y.o.   MRN: [id number]  HPI  Here with acute onset AGE like symptoms beginning about 3 am with awakening with n/v which then resolved, but after with general weakness and malaise, crampy abd pains that seems to come in "waves", some abd distension, feverish, no chills, but with freq watery diarrhea, no blood. Denies rash, joint pains, hx of abd surgury.   Denies urinary symptoms such as dysuria, frequency, urgency,or hematuria.  No hx of STD.  Not pregnant, not sexually active since LMP 2 wks ago.   Past Medical History  Diagnosis Date  . Asthma   . Allergic rhinitis, cause unspecified   . Anxiety   . Migraine headache    No past surgical history on file.  reports that she has been smoking Cigarettes.  She has a 3 pack-year smoking history. She does not have any smokeless tobacco history on file. She reports that she drinks alcohol. She reports that she does not use illicit drugs. family history is not on file. Allergies  Allergen Reactions  . Neomycin-Bacitracin Zn-Polymyx    Current Outpatient Prescriptions on File Prior to Visit  Medication Sig Dispense Refill  . albuterol (PROVENTIL HFA) 108 (90 BASE) MCG/ACT inhaler Inhale 2 puffs into the lungs every 4 (four) hours as needed for wheezing or shortness of breath.  1 Inhaler  prn  . Azelastine-Fluticasone (DYMISTA) 137-50 MCG/ACT SUSP Place 2 sprays into both nostrils at bedtime.  1 Bottle  0  . fexofenadine (ALLEGRA) 180 MG tablet Take 0.5 tablets (90 mg total) by mouth daily.      . mometasone-formoterol (DULERA) 100-5 MCG/ACT AERO Inhale 2 puffs into the lungs 2 (two) times daily.  1 Inhaler  0  . mometasone-formoterol (DULERA) 100-5 MCG/ACT AERO 2 puffs then rinse mouth, twice daily  1 Inhaler  prn  . montelukast (SINGULAIR) 10 MG tablet TAKE 1 TABLET BY MOUTH AT BEDTIME  90 tablet  0   Review of Systems  Constitutional: Negative for diaphoresis and  unexpected weight change.  HENT: Negative for tinnitus.   Eyes: Negative for photophobia and visual disturbance.  Respiratory: Negative for choking and stridor.   Gastrointestinal: Negative for  blood in stool.  Genitourinary: Negative for hematuria and decreased urine volume.  Musculoskeletal: Negative for gait problem.  Skin: Negative for color change and wound.  Neurological: Negative for tremors and numbness.  Psychiatric/Behavioral: Negative for decreased concentration. The patient is not hyperactive.       Objective:   Physical Exam BP 118/82  Pulse 94  Temp 98.3 F (36.8 C) (Oral)  Ht 5\' 4"  (1.626 m)  Wt 145 lb (65.772 kg)  BMI 24.89 kg/m2  SpO2 99%  LMP 11/28/2012 Physical Exam  VS noted, mild iol Constitutional: Pt appears well-developed and well-nourished.  HENT: Head: Normocephalic.  Right Ear: External ear normal.  Left Ear: External ear normal.  Bilat tm's mild erythema.  Sinus nontender.  Pharynx mild erythema Eyes: Conjunctivae and EOM are normal. Pupils are equal, round, and reactive to light.  Neck: Normal range of motion. Neck supple.  Cardiovascular: Normal rate and regular rhythm.   Pulmonary/Chest: Effort normal and breath sounds normal.  Abd:  Soft, diffuse mild to mod tender, mild distended, + but decreased BS, no guarding or rebound Neurological: Pt is alert. Not confused, no rash  Skin: Skin is warm. No erythema.  Psychiatric: Pt behavior is normal. Thought content normal.  Assessment & Plan:

## 2012-12-12 NOTE — Assessment & Plan Note (Addendum)
PFTs are consistent with asthma pattern. We emphasized environmental factors including removal of rabbit. Continued Dulera and Singulair. Total IgE is currently too high for Xolair therapy

## 2012-12-12 NOTE — Assessment & Plan Note (Signed)
Exam c/w prob viral AGE but with n/v, abd pain and distension more than typical for viral illness - for pain control, anti-emetic, cbc and labs, and acute abd films - r/o obstruction

## 2012-12-12 NOTE — Assessment & Plan Note (Signed)
Watery type acute onset prob viral, for lomotil prn, for stool studies if not improved as per natural hx typical illness

## 2012-12-12 NOTE — Assessment & Plan Note (Signed)
None per pt since onset illness at 3am today, for zofran for any worsening, also work note given

## 2012-12-13 LAB — BASIC METABOLIC PANEL
CO2: 26 mEq/L (ref 19–32)
Calcium: 9.4 mg/dL (ref 8.4–10.5)
Glucose, Bld: 97 mg/dL (ref 70–99)
Sodium: 141 mEq/L (ref 135–145)

## 2012-12-13 LAB — LIPASE: Lipase: 15 U/L (ref 11.0–59.0)

## 2012-12-13 LAB — HEPATIC FUNCTION PANEL
ALT: 14 U/L (ref 0–35)
AST: 18 U/L (ref 0–37)
Albumin: 4.3 g/dL (ref 3.5–5.2)
Alkaline Phosphatase: 68 U/L (ref 39–117)
Total Protein: 7.1 g/dL (ref 6.0–8.3)

## 2013-01-02 ENCOUNTER — Telehealth: Payer: Self-pay | Admitting: Internal Medicine

## 2013-01-02 NOTE — Telephone Encounter (Signed)
Called, spoke with pt.  She was seen by Dr. Maple Hudson on 12/04/12:  Patient Instructions     Script sent for [facility] maintnenance inhaler  Sample Dymista nasal spray- Try 1-2 puffs each nostril once every day at bedtime. Can be used two times daily if needed  Please call as needed    ----  Pt states the Dymista worked well but her insurance will not pay for this.  She would like to know if there is an alternative.  Dr. Maple Hudson, pls advise.  Thank you.  Allergies  Allergen Reactions  . Neomycin-Bacitracin Zn-Polymyx     [facility]

## 2013-01-03 MED ORDER — FLUTICASONE PROPIONATE 50 MCG/ACT NA SUSP: NASAL | Status: DC

## 2013-01-03 NOTE — Telephone Encounter (Signed)
Per CY-lets have patient try Flonase # 1 2 puffs each nostril every night at bedtime with prn refills.

## 2013-01-03 NOTE — Telephone Encounter (Signed)
I spoke with pt. Aware of CDY recs. Nothing further was needed. rx has been sent.

## 2013-03-04 ENCOUNTER — Ambulatory Visit: Payer: Private Health Insurance - Indemnity | Admitting: Internal Medicine

## 2013-04-10 ENCOUNTER — Ambulatory Visit (INDEPENDENT_AMBULATORY_CARE_PROVIDER_SITE_OTHER): Payer: Private Health Insurance - Indemnity | Admitting: Internal Medicine

## 2013-04-10 ENCOUNTER — Ambulatory Visit (INDEPENDENT_AMBULATORY_CARE_PROVIDER_SITE_OTHER)
Admission: RE | Admit: 2013-04-10 | Discharge: 2013-04-10 | Disposition: A | Discharge status: Home / Self Care | Payer: Private Health Insurance - Indemnity | Source: Ambulatory Visit | Attending: Internal Medicine | Admitting: Internal Medicine

## 2013-04-10 ENCOUNTER — Other Ambulatory Visit: Payer: Self-pay | Admitting: Internal Medicine

## 2013-04-10 ENCOUNTER — Encounter: Payer: Self-pay | Admitting: Internal Medicine

## 2013-04-10 VITALS — BP 124/80 | HR 84 | Ht 63.75 in | Wt 150.0 lb

## 2013-04-10 DIAGNOSIS — J40 Bronchitis, not specified as acute or chronic: Secondary | ICD-10-CM

## 2013-04-10 DIAGNOSIS — J45998 Other asthma: Secondary | ICD-10-CM

## 2013-04-10 DIAGNOSIS — J302 Other seasonal allergic rhinitis: Secondary | ICD-10-CM

## 2013-04-10 DIAGNOSIS — F17200 Nicotine dependence, unspecified, uncomplicated: Secondary | ICD-10-CM

## 2013-04-10 DIAGNOSIS — J45909 Unspecified asthma, uncomplicated: Secondary | ICD-10-CM

## 2013-04-10 DIAGNOSIS — J309 Allergic rhinitis, unspecified: Secondary | ICD-10-CM

## 2013-04-10 MED ORDER — AZELASTINE HCL 0.1 % NA SOLN: 1.0000 | Freq: Two times a day (BID) | NASAL | Status: DC

## 2013-04-10 NOTE — Progress Notes (Signed)
10/18/12- 51 yoF smoker referred courtesy of Dr Felicity Coyer for allergy evaluation, with hx of allergic rhinitis, asthma. She admits to smoking one or 2 cigarettes "some days". She gives a history of perennial rhinitis symptoms with postnasal drip since childhood. Triggers have included house dust, fall leaves, perfumes. Grass causes hives Now worse since early September. Using her rescue inhaler more than one time per day. Usually asthma is controlled with just Singulair. Never emergency room for asthma. Has never felt allergies were dangerous and has not had an EpiPen. House with basement, pet rabbit, husband smokes and she smokes occasionally. No mold.  12/04/12- 22 yoF smoker referred courtesy of Dr Felicity Coyer for allergy evaluation, with hx of allergic rhinitis, asthma. FOLLOWS FOR: allergies are better than in the fall; never goes away. Asthma is controlled with Dulera, one puff once daily. Continue Singulair. Rescue inhaler once or twice a week. Allegra if needed. Allergy Profile-10/18/2012-total IgE 1393 with significant elevations for most allergens on the panel. This is expected with a total IgE this high. Particular elevation for cat. Rabbit was also elevated and she still has a rabbit in the house. I recommended it be removed. PFT: 11/11/2012-moderate obstructive airways disease with response to bronchodilator. Normal lung volumes, normal diffusion. FEV1 3.06/105%, FEV1/FVC 0.65. Significant response to bronchodilator. TLC 112%, DLCO 101%.  04/10/13-  57 yoF smoker referred courtesy of Dr Felicity Coyer for allergy evaluation, with hx of allergic rhinitis, asthma/ COPD. FOLLOWS FOR: Feels like her medication needs to be changed. Has had an issue with Flonase and making her sinuses bleed. Elwin Sleight is getting expensive and would like to change. Recently had a cold. Pollen season has not been bad. Like Dymista nasal spray but because of cost asks trial of Astelin. Using [facility] one time daily to minimize  cost.  ROS-see HPI Constitutional:   No-   weight loss, night sweats, fevers, chills, fatigue, lassitude. HEENT:   No-  headaches, difficulty swallowing, tooth/dental problems, sore throat,       + sneezing, itching, ear ache, nasal congestion, post nasal drip,  CV:  No-   chest pain, orthopnea, PND, swelling in lower extremities, anasarca,  dizziness, palpitations Resp: No-   shortness of breath with exertion or at rest.              No-   productive cough,  No non-productive cough,  No- coughing up of blood.              No-   change in color of mucus.  + wheezing.   Skin:  + hives GI:  No-   heartburn, indigestion, abdominal pain, nausea, vomiting, GU:  MS:  No-   joint pain or swelling.   Neuro-     nothing unusual Psych:  No- change in mood or affect. No depression or anxiety.  No memory loss.  OBJ- Physical Exam General- Alert, Oriented, Affect-appropriate, Distress- none acute Skin- rash-none, lesions- none, excoriation- none Lymphadenopathy- none Head- atraumatic            Eyes- Gross vision intact, PERRLA, conjunctivae and secretions clear            Ears- Hearing, canals-normal            Nose- + turbinate edema, no-Septal dev, mucus, polyps, erosion, perforation             Throat- Mallampati II , mucosa clear , drainage- none, tonsils- atrophic. + Tongue stud Neck- flexible , trachea midline, no stridor , thyroid nl,  carotid no bruit Chest - symmetrical excursion , unlabored           Heart/CV- RRR , no murmur , no gallop  , no rub, nl s1 s2                           - JVD- none , edema- none, stasis changes- none, varices- none           Lung- clear to P&A, no- wheeze, no- cough , dullness-none, rub- none           Chest wall-  Abd-  Br/ Gen/ Rectal- Not done, not indicated Extrem- cyanosis- none, clubbing, none, atrophy- none, strength- nl Neuro- grossly intact to observation

## 2013-04-10 NOTE — Patient Instructions (Addendum)
Sample steroid inhaler Aerospan   2 puffs then rinse mouth, twice daily     Try this instead of Dulera  Script sent for Astelin antihistamine nasal spray    1-2 puffs each nostril, once or twice daily as needed. You can take this with flonase if you need.   Order- CXR  Dx bronchitis  Please keep trying to stop smoking

## 2013-04-17 NOTE — Progress Notes (Signed)
Quick Note:  Pt aware of results. ______ 

## 2013-04-18 ENCOUNTER — Encounter: Payer: Self-pay | Admitting: Internal Medicine

## 2013-04-18 DIAGNOSIS — F17200 Nicotine dependence, unspecified, uncomplicated: Secondary | ICD-10-CM | POA: Insufficient documentation

## 2013-04-18 NOTE — Assessment & Plan Note (Signed)
I reminded her of our discussion about tobacco cessation and support.

## 2013-04-18 NOTE — Assessment & Plan Note (Addendum)
We have discussed the importance of tobacco cessation and getting a pet rabbit out of her living area. Plan-try Astelin instead of Flonase. Consider Nasalcrom later.

## 2013-04-18 NOTE — Assessment & Plan Note (Signed)
She continues to smoke which is the key issue. Cost concerns limit use of Dulera. Discussed trial of maintenance steroid inhaler.

## 2013-05-01 ENCOUNTER — Telehealth: Payer: Self-pay | Admitting: Internal Medicine

## 2013-05-01 MED ORDER — MOMETASONE FURO-FORMOTEROL FUM 100-5 MCG/ACT IN AERO: INHALATION_SPRAY | RESPIRATORY_TRACT | Status: DC

## 2013-05-01 NOTE — Telephone Encounter (Signed)
Per CY-go ahead and give [facility] Rx again. Pt aware that Rx has been sent. Nothing more needed at this time; will sign off on message.

## 2013-08-01 ENCOUNTER — Other Ambulatory Visit: Payer: Self-pay | Admitting: Internal Medicine

## 2013-10-10 ENCOUNTER — Ambulatory Visit (INDEPENDENT_AMBULATORY_CARE_PROVIDER_SITE_OTHER): Payer: Managed Care, Other (non HMO) | Admitting: Internal Medicine

## 2013-10-10 ENCOUNTER — Encounter: Payer: Self-pay | Admitting: Internal Medicine

## 2013-10-10 VITALS — BP 118/78 | HR 68 | Ht 63.75 in | Wt 154.0 lb

## 2013-10-10 DIAGNOSIS — Z23 Encounter for immunization: Secondary | ICD-10-CM

## 2013-10-10 DIAGNOSIS — J310 Chronic rhinitis: Secondary | ICD-10-CM

## 2013-10-10 DIAGNOSIS — F17200 Nicotine dependence, unspecified, uncomplicated: Secondary | ICD-10-CM

## 2013-10-10 DIAGNOSIS — J302 Other seasonal allergic rhinitis: Secondary | ICD-10-CM

## 2013-10-10 DIAGNOSIS — J309 Allergic rhinitis, unspecified: Secondary | ICD-10-CM

## 2013-10-10 MED ORDER — PHENYLEPHRINE HCL 1 % NA SOLN
3.0000 [drp] | Freq: Once | NASAL | Status: AC
  Administered 2013-10-10: 3 [drp] via NASAL

## 2013-10-10 MED ORDER — METHYLPREDNISOLONE ACETATE 80 MG/ML IJ SUSP
80.0000 mg | Freq: Once | INTRAMUSCULAR | Status: AC
  Administered 2013-10-10: 80 mg via INTRAMUSCULAR

## 2013-10-10 MED ORDER — AZITHROMYCIN 250 MG PO TABS: ORAL_TABLET | ORAL | Status: DC

## 2013-10-10 NOTE — Progress Notes (Signed)
10/18/12- 70 yoF smoker referred courtesy of Dr Felicity Coyer for allergy evaluation, with hx of allergic rhinitis, asthma. She admits to smoking one or 2 cigarettes "some days". She gives a history of perennial rhinitis symptoms with postnasal drip since childhood. Triggers have included house dust, fall leaves, perfumes. Grass causes hives Now worse since early September. Using her rescue inhaler more than one time per day. Usually asthma is controlled with just Singulair. Never emergency room for asthma. Has never felt allergies were dangerous and has not had an EpiPen. House with basement, pet rabbit, husband smokes and she smokes occasionally. No mold.  12/04/12- 71 yoF smoker referred courtesy of Dr Felicity Coyer for allergy evaluation, with hx of allergic rhinitis, asthma. FOLLOWS FOR: allergies are better than in the fall; never goes away. Asthma is controlled with Dulera, one puff once daily. Continue Singulair. Rescue inhaler once or twice a week. Allegra if needed. Allergy Profile-10/18/2012-total IgE 1393 with significant elevations for most allergens on the panel. This is expected with a total IgE this high. Particular elevation for cat. Rabbit was also elevated and she still has a rabbit in the house. I recommended it be removed. PFT: 11/11/2012-moderate obstructive airways disease with response to bronchodilator. Normal lung volumes, normal diffusion. FEV1 3.06/105%, FEV1/FVC 0.65. Significant response to bronchodilator. TLC 112%, DLCO 101%.  04/10/13-  28 yoF smoker referred courtesy of Dr Felicity Coyer for allergy evaluation, with hx of allergic rhinitis, asthma/ COPD. FOLLOWS FOR: Feels like her medication needs to be changed. Has had an issue with Flonase and making her sinuses bleed. Elwin Sleight is getting expensive and would like to change. Recently had a cold. Pollen season has not been bad. Like Dymista nasal spray but because of cost asks trial of Astelin. Using [facility] one time daily to minimize  cost.  10/10/13- 37 yoF smoker referred courtesy of Dr Felicity Coyer for allergy evaluation, with hx of allergic rhinitis, asthma/ COPD. FOLLOWS XBJ:[id number] in sinus area; drainage;? ear infection. Watery rhinorhea, using Astelin and Flonase. Much less sneeze. Smoking less.   ROS-see HPI Constitutional:   No-   weight loss, night sweats, fevers, chills, fatigue, lassitude. HEENT:   No-  headaches, difficulty swallowing, tooth/dental problems, sore throat,       + sneezing, itching, ear ache, nasal congestion, post nasal drip,  CV:  No-   chest pain, orthopnea, PND, swelling in lower extremities, anasarca,  dizziness, palpitations Resp: No-   shortness of breath with exertion or at rest.              No-   productive cough,  No non-productive cough,  No- coughing up of blood.              No-   change in color of mucus. No- wheezing.   Skin:  + hives GI:  No-   heartburn, indigestion, abdominal pain, nausea, vomiting, GU:  MS:  No-   joint pain or swelling.   Neuro-     nothing unusual Psych:  No- change in mood or affect. No depression or anxiety.  No memory loss.  OBJ- Physical Exam General- Alert, Oriented, Affect-appropriate, Distress- none acute Skin- rash-none, lesions- none, excoriation- none Lymphadenopathy- none Head- atraumatic            Eyes- Gross vision intact, PERRLA, conjunctivae and secretions clear            Ears- Hearing, canals-normal            Nose- clear, no-Septal dev, mucus, polyps, erosion,  perforation             Throat- Mallampati II , mucosa clear , drainage- none, tonsils- atrophic. + Tongue stud Neck- flexible , trachea midline, no stridor , thyroid nl, carotid no bruit Chest - symmetrical excursion , unlabored           Heart/CV- RRR , no murmur , no gallop  , no rub, nl s1 s2                           - JVD- none , edema- none, stasis changes- none, varices- none           Lung- clear to P&A, no- wheeze, no- cough , dullness-none, rub- none            Chest wall-  Abd-  Br/ Gen/ Rectal- Not done, not indicated Extrem- cyanosis- none, clubbing, none, atrophy- none, strength- nl Neuro- grossly intact to observation

## 2013-10-10 NOTE — Patient Instructions (Signed)
Flu vax  Depo 80  Neb neo nasal  Ok to continue the astelin and flonase nasal sprays  Script for Z pak antibiotic to hold in case needed

## 2013-10-26 DIAGNOSIS — J310 Chronic rhinitis: Secondary | ICD-10-CM | POA: Insufficient documentation

## 2013-10-26 NOTE — Assessment & Plan Note (Signed)
Rhinitis with eustachian dysfunction Plan- Zpak, depomedrol, flu vax

## 2013-10-26 NOTE — Assessment & Plan Note (Signed)
Tried to build on her reduction to get her thinking she can quit.

## 2013-11-06 ENCOUNTER — Telehealth: Payer: Self-pay | Admitting: Internal Medicine

## 2013-11-06 MED ORDER — ALBUTEROL SULFATE HFA 108 (90 BASE) MCG/ACT IN AERS: 2.0000 | INHALATION_SPRAY | RESPIRATORY_TRACT | Status: DC | PRN

## 2013-11-06 MED ORDER — MONTELUKAST SODIUM 10 MG PO TABS: 10.0000 mg | ORAL_TABLET | Freq: Every day | ORAL | Status: DC

## 2013-11-06 NOTE — Telephone Encounter (Signed)
Pt aware rx's have been sent for 90 day supply per her request. Nothing further needed

## 2013-11-06 NOTE — Telephone Encounter (Signed)
Per CY-okay to refill as requested; keep OV in April 2015.

## 2013-11-06 NOTE — Telephone Encounter (Signed)
Pt is requesting a refill on Singular 10mg  and proventil hfa 108 inhaler.  Pt presently on both of these medications Please advise if ok to refill CY.

## 2014-02-13 ENCOUNTER — Telehealth: Payer: Self-pay | Admitting: Internal Medicine

## 2014-02-13 DIAGNOSIS — J40 Bronchitis, not specified as acute or chronic: Secondary | ICD-10-CM

## 2014-02-13 MED ORDER — MOMETASONE FURO-FORMOTEROL FUM 100-5 MCG/ACT IN AERO: INHALATION_SPRAY | RESPIRATORY_TRACT | Status: DC

## 2014-02-13 MED ORDER — AZELASTINE HCL 0.1 % NA SOLN: 1.0000 | Freq: Two times a day (BID) | NASAL | Status: DC

## 2014-02-13 MED ORDER — ALBUTEROL SULFATE HFA 108 (90 BASE) MCG/ACT IN AERS: 2.0000 | INHALATION_SPRAY | RESPIRATORY_TRACT | Status: DC | PRN

## 2014-02-13 MED ORDER — MONTELUKAST SODIUM 10 MG PO TABS: 10.0000 mg | ORAL_TABLET | Freq: Every day | ORAL | Status: DC

## 2014-02-13 NOTE — Telephone Encounter (Signed)
RX's have been sent. Nothing furhter needed

## 2014-02-18 ENCOUNTER — Other Ambulatory Visit: Payer: Self-pay | Admitting: Internal Medicine

## 2014-02-18 DIAGNOSIS — J40 Bronchitis, not specified as acute or chronic: Secondary | ICD-10-CM

## 2014-02-18 MED ORDER — MOMETASONE FURO-FORMOTEROL FUM 100-5 MCG/ACT IN AERO: INHALATION_SPRAY | RESPIRATORY_TRACT | Status: DC

## 2014-02-18 MED ORDER — AZELASTINE HCL 0.1 % NA SOLN: 1.0000 | Freq: Two times a day (BID) | NASAL | Status: DC

## 2014-02-18 MED ORDER — MONTELUKAST SODIUM 10 MG PO TABS: 10.0000 mg | ORAL_TABLET | Freq: Every day | ORAL | Status: DC

## 2014-02-18 MED ORDER — ALBUTEROL SULFATE HFA 108 (90 BASE) MCG/ACT IN AERS: 2.0000 | INHALATION_SPRAY | RESPIRATORY_TRACT | Status: DC | PRN

## 2014-02-18 NOTE — Telephone Encounter (Signed)
Per CY-okay to send refills as requested. Rx's sent to pharmacy

## 2014-04-10 ENCOUNTER — Ambulatory Visit: Payer: Managed Care, Other (non HMO) | Admitting: Internal Medicine

## 2014-05-04 ENCOUNTER — Ambulatory Visit (INDEPENDENT_AMBULATORY_CARE_PROVIDER_SITE_OTHER): Payer: Managed Care, Other (non HMO) | Admitting: Internal Medicine

## 2014-05-04 ENCOUNTER — Ambulatory Visit (INDEPENDENT_AMBULATORY_CARE_PROVIDER_SITE_OTHER)
Admission: RE | Admit: 2014-05-04 | Discharge: 2014-05-04 | Disposition: A | Discharge status: Home / Self Care | Payer: Managed Care, Other (non HMO) | Source: Ambulatory Visit | Attending: Internal Medicine | Admitting: Internal Medicine

## 2014-05-04 ENCOUNTER — Encounter: Payer: Self-pay | Admitting: Internal Medicine

## 2014-05-04 VITALS — BP 114/72 | HR 94 | Ht 63.75 in | Wt 154.6 lb

## 2014-05-04 DIAGNOSIS — H1013 Acute atopic conjunctivitis, bilateral: Secondary | ICD-10-CM

## 2014-05-04 DIAGNOSIS — J40 Bronchitis, not specified as acute or chronic: Secondary | ICD-10-CM

## 2014-05-04 DIAGNOSIS — J3089 Other allergic rhinitis: Principal | ICD-10-CM

## 2014-05-04 DIAGNOSIS — F17200 Nicotine dependence, unspecified, uncomplicated: Secondary | ICD-10-CM

## 2014-05-04 DIAGNOSIS — J309 Allergic rhinitis, unspecified: Secondary | ICD-10-CM

## 2014-05-04 DIAGNOSIS — H1045 Other chronic allergic conjunctivitis: Secondary | ICD-10-CM

## 2014-05-04 DIAGNOSIS — J4520 Mild intermittent asthma, uncomplicated: Secondary | ICD-10-CM

## 2014-05-04 DIAGNOSIS — J302 Other seasonal allergic rhinitis: Secondary | ICD-10-CM

## 2014-05-04 DIAGNOSIS — J45909 Unspecified asthma, uncomplicated: Secondary | ICD-10-CM

## 2014-05-04 MED ORDER — MOMETASONE FURO-FORMOTEROL FUM 200-5 MCG/ACT IN AERO: 2.0000 | INHALATION_SPRAY | Freq: Two times a day (BID) | RESPIRATORY_TRACT | Status: DC

## 2014-05-04 NOTE — Patient Instructions (Addendum)
Order  CXR  Dx bronchitis       Please think to skip the cigarettes!  Sample Dulera 200   2 puffs then rinse mouth once daily [facility] the [facility] is done[facility]  Consider trying otc Nasalcrm/ cromol/ cromolyn nasal spray for allergic nose problems  Consider an otc allergy eye drop like Naphcon or Allaway  You might ask your insurance if they cover sublingual immunotherapy for Ragweed Allergy     "Ragwitek". If so we would start it now, in anticipation of fall ragweed season.

## 2014-05-04 NOTE — Progress Notes (Signed)
10/18/12- [age] yoF smoker referred courtesy of Dr Felicity CoyerLeschber for allergy evaluation, with hx of allergic rhinitis, asthma. She admits to smoking one or 2 cigarettes "some days". She gives a history of perennial rhinitis symptoms with postnasal drip since childhood. Triggers have included house dust, fall leaves, perfumes. Grass causes hives Now worse since early September. Using her rescue inhaler more than one time per day. Usually asthma is controlled with just Singulair. Never emergency room for asthma. Has never felt allergies were dangerous and has not had an EpiPen. House with basement, pet rabbit, husband smokes and she smokes occasionally. No mold.  12/04/12- [age] yoF smoker referred courtesy of Dr Felicity CoyerLeschber for allergy evaluation, with hx of allergic rhinitis, asthma. FOLLOWS FOR: allergies are better than in the fall; never goes away. Asthma is controlled with Dulera, one puff once daily. Continue Singulair. Rescue inhaler once or twice a week. Allegra if needed. Allergy Profile-10/18/2012-total IgE 1393 with significant elevations for most allergens on the panel. This is expected with a total IgE this high. Particular elevation for cat. Rabbit was also elevated and she still has a rabbit in the house. I recommended it be removed. PFT: 11/11/2012-moderate obstructive airways disease with response to bronchodilator. Normal lung volumes, normal diffusion. FEV1 3.06/105%, FEV1/FVC 0.65. Significant response to bronchodilator. TLC 112%, DLCO 101%.  04/10/13-  [age] yoF smoker referred courtesy of Dr Felicity CoyerLeschber for allergy evaluation, with hx of allergic rhinitis, asthma/ COPD. FOLLOWS FOR: Feels like her medication needs to be changed. Has had an issue with Flonase and making her sinuses bleed. [facility] SleightDulera is getting expensive and would like to change. Recently had a cold. Pollen season has not been bad. Like Dymista nasal spray but because of cost asks trial of Astelin. [facility]  [facility].  10/10/13- 37 yoF smoker referred courtesy of Dr Felicity CoyerLeschber for allergy evaluation, with hx of allergic rhinitis, asthma/ COPD. FOLLOWS ZOX:WRUEAVWUFOR:pressure in sinus area; drainage;? ear infection. Watery rhinorhea, using Astelin and Flonase. Much less sneeze. Smoking less.   05/04/14- 37 yoF smoker referred courtesy of Dr Felicity CoyerLeschber for allergy evaluation, with hx of allergic rhinitis, asthma/ COPD. FOLLOWS FOR: itchy ears, throat, and eyes. "frog" in throat. Wheezing more often. Has to use Dulera more than what she is used to.  Caught a cold after [place] in early April, treated with antibiotic and low-dose prednisone. Flared again with pollen season mid/late April. Much better now but eyes still itch. Throat feels irritated. No headache, purulent discharge or fever. Taking Allegra plus Benadryl.  ROS-see HPI Constitutional:   No-   weight loss, night sweats, fevers, chills, fatigue, lassitude. HEENT:   No-  headaches, difficulty swallowing, tooth/dental problems, sore throat,       No-sneezing,+ itching, ear ache, nasal congestion, +post nasal drip,  CV:  No-   chest pain, orthopnea, PND, swelling in lower extremities, anasarca,  dizziness, palpitations Resp: No-   shortness of breath with exertion or at rest.              No-   productive cough,  + non-productive cough,  No- coughing up of blood.              No-   change in color of mucus. No- wheezing.   Skin:  + hives GI:  No-   heartburn, indigestion, abdominal pain, nausea, vomiting, GU:  MS:  No-   joint pain or swelling.   Neuro-     nothing unusual Psych:  No-  change in mood or affect. No depression or anxiety.  No memory loss.  OBJ- Physical Exam General- Alert, Oriented, Affect-appropriate, Distress- none acute Skin- rash-none, lesions- none, excoriation- none Lymphadenopathy- none Head- atraumatic            Eyes- Gross vision intact, PERRLA, conjunctivae and secretions clear            Ears- Hearing, canals-normal             Nose- clear, no-Septal dev, mucus, polyps, erosion, perforation             Throat- Mallampati II , mucosa clear , drainage- none, tonsils- atrophic. + Tongue stud Neck- flexible , trachea midline, no stridor , thyroid nl, carotid no bruit Chest - symmetrical excursion , unlabored           Heart/CV- RRR , no murmur , no gallop  , no rub, nl s1 s2                           - JVD- none , edema- none, stasis changes- none, varices- none           Lung- clear to P&A,  Wheeze-none, cough+dry , dullness-none, rub- none           Chest wall-  Abd-  Br/ Gen/ Rectal- Not done, not indicated Extrem- cyanosis- none, clubbing, none, atrophy- none, strength- nl Neuro- grossly intact to observation

## 2014-05-10 IMAGING — CR DG CHEST 2V
2 series · 2 of 2 positions shown · non-contrast
Comparison: 12/12/2012

CLINICAL DATA: Bronchitis

CHEST - 2 VIEW

[view not recorded (1 of 2)]
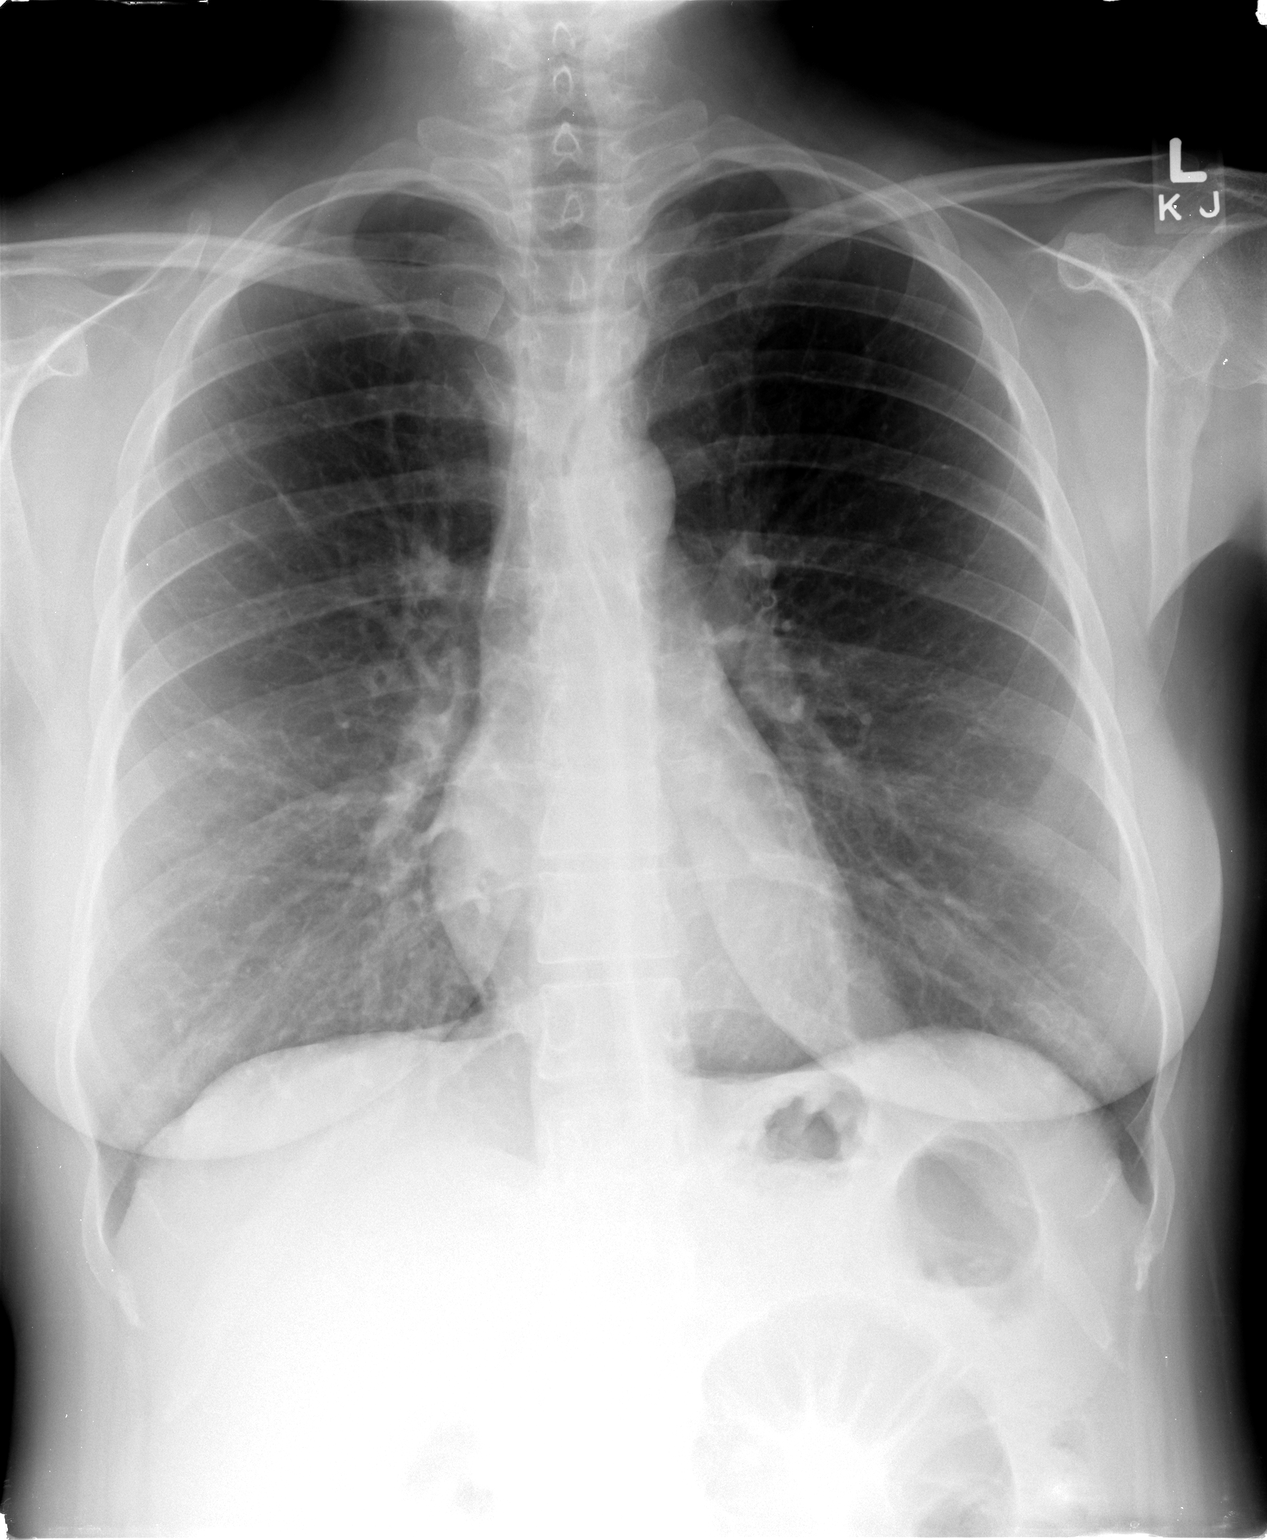

[view not recorded (2 of 2)]
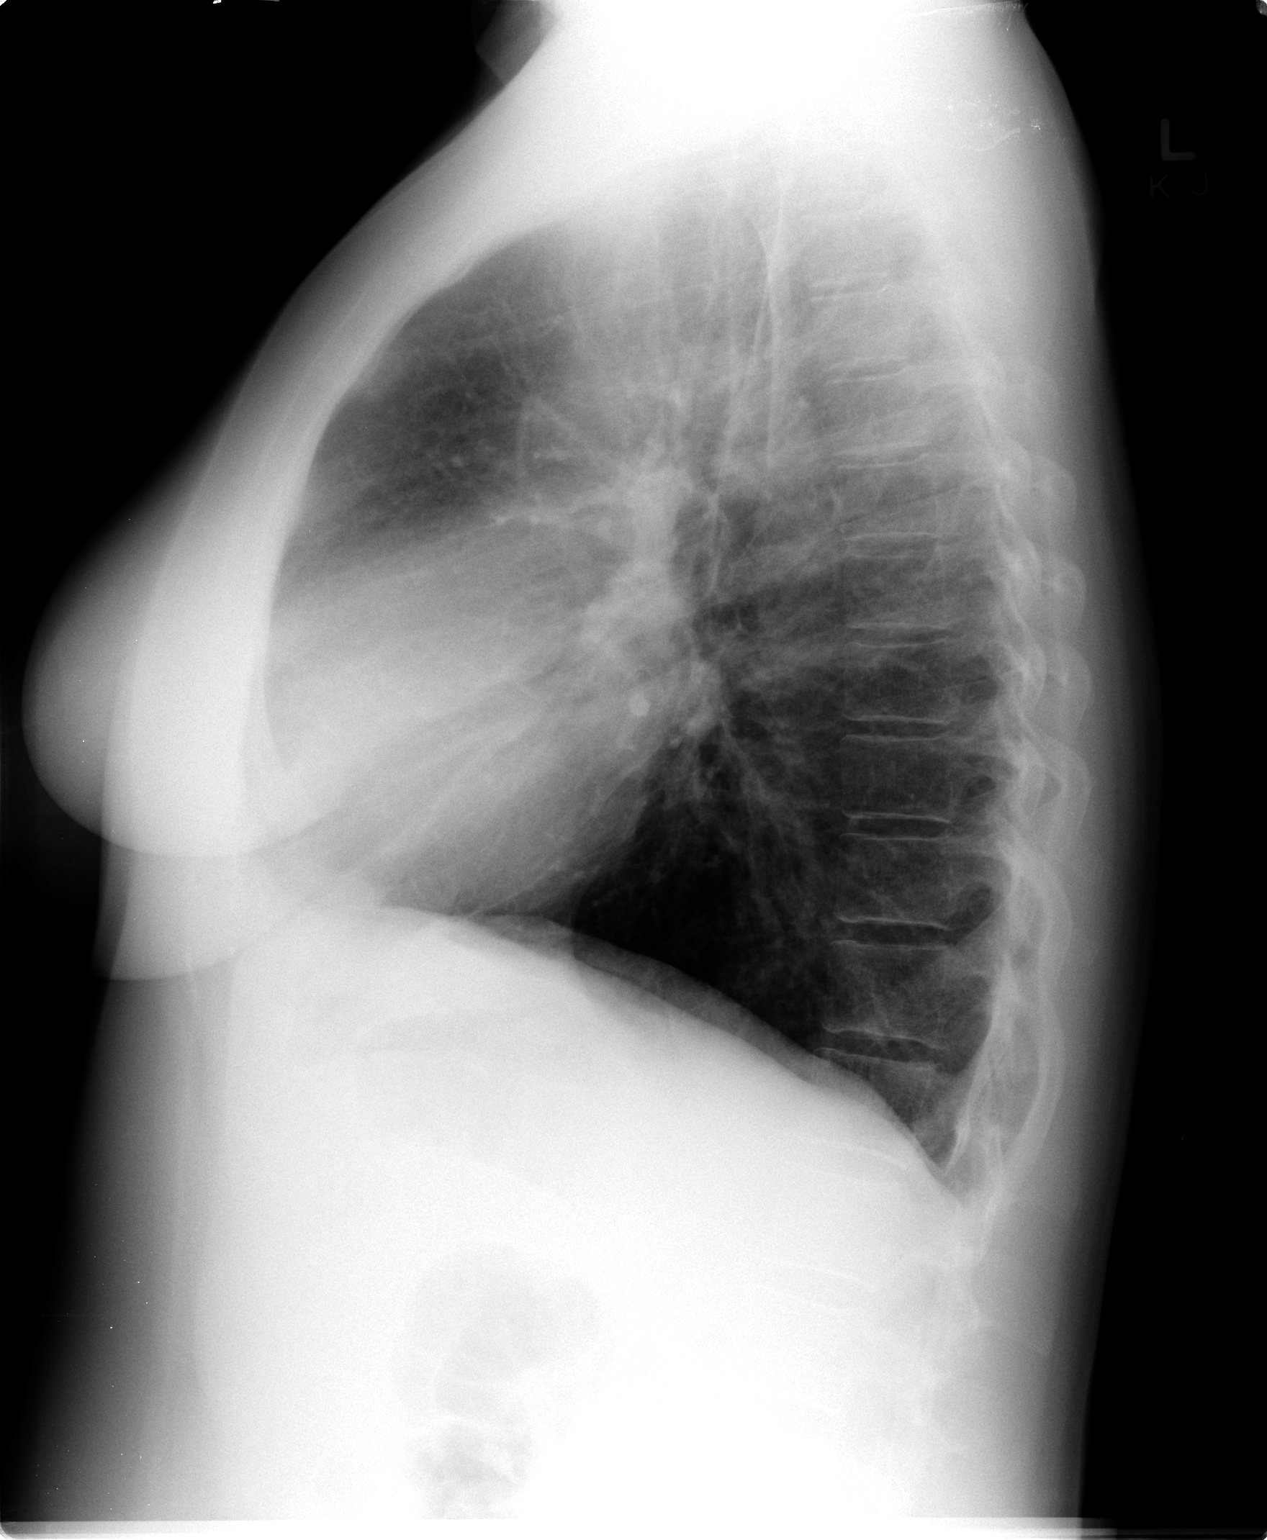

[2 of 2 positions shown; findings below may reference images not displayed]

FINDINGS: Cardiomediastinal silhouette is stable.  No acute
infiltrate or pleural effusion. No pulmonary edema.  Bony thorax is
stable.
IMPRESSION: No active disease.  No significant change.

## 2014-05-14 NOTE — Progress Notes (Signed)
Quick Note:  lmtcb ______ 

## 2014-06-14 DIAGNOSIS — H1013 Acute atopic conjunctivitis, bilateral: Secondary | ICD-10-CM | POA: Insufficient documentation

## 2014-06-14 NOTE — Assessment & Plan Note (Signed)
counseled

## 2014-06-14 NOTE — Assessment & Plan Note (Signed)
In a smoker, symptoms are less specific Plan-chest x-ray, sample Dulera 200

## 2014-06-14 NOTE — Assessment & Plan Note (Signed)
Increased symptoms in April coincide with grass pollen season Plan-try Nasalcrom

## 2014-06-14 NOTE — Assessment & Plan Note (Signed)
Discussed possibility of excessive drying from antihistamines. Plan-give names of several over-the-counter allergy eyedrops and artificial tears for her to try

## 2014-07-24 ENCOUNTER — Ambulatory Visit (INDEPENDENT_AMBULATORY_CARE_PROVIDER_SITE_OTHER): Payer: Managed Care, Other (non HMO) | Admitting: Physician Assistant

## 2014-07-24 ENCOUNTER — Encounter: Payer: Self-pay | Admitting: Physician Assistant

## 2014-07-24 VITALS — BP 128/82 | HR 107 | Temp 98.9°F | Resp 20 | Wt 149.0 lb

## 2014-07-24 DIAGNOSIS — R062 Wheezing: Secondary | ICD-10-CM

## 2014-07-24 DIAGNOSIS — J45901 Unspecified asthma with (acute) exacerbation: Secondary | ICD-10-CM

## 2014-07-24 MED ORDER — HYDROCODONE-HOMATROPINE 5-1.5 MG/5ML PO SYRP: 5.0000 mL | ORAL_SOLUTION | Freq: Every day | ORAL | Status: DC

## 2014-07-24 MED ORDER — IPRATROPIUM-ALBUTEROL 0.5-2.5 (3) MG/3ML IN SOLN
3.0000 mL | Freq: Once | RESPIRATORY_TRACT | Status: AC
  Administered 2014-07-24: 3 mL via RESPIRATORY_TRACT

## 2014-07-24 MED ORDER — PREDNISONE 20 MG PO TABS: 20.0000 mg | ORAL_TABLET | Freq: Two times a day (BID) | ORAL | Status: DC

## 2014-07-24 NOTE — Progress Notes (Signed)
Pre visit review using our clinic review tool, if applicable. No additional management support is needed unless otherwise documented below in the visit note. 

## 2014-07-24 NOTE — Progress Notes (Signed)
Subjective:    Patient ID: Alexis DouglasLydia S Hodges, female    DOB: 07/05/1976, 38 y.o.   MRN: [id number]  Cough This is a new problem. The current episode started in the past 7 days. The problem has been gradually worsening. The problem occurs every few minutes. The cough is productive of sputum. Associated symptoms include shortness of breath and wheezing. Pertinent negatives include no chest pain, chills, ear congestion, ear pain, fever, headaches, heartburn, hemoptysis, myalgias, nasal congestion, postnasal drip, rash, rhinorrhea, sore throat, sweats or weight loss. Nothing aggravates the symptoms. Risk factors for lung disease include smoking/tobacco exposure. Treatments tried: albuterol and dulera, mucinex, singulair, astaline. The treatment provided mild relief. Her past medical history is significant for asthma, bronchitis and environmental allergies. There is no history of COPD.      Review of Systems  Constitutional: Negative for fever, chills and weight loss.  HENT: Negative for ear pain, postnasal drip, rhinorrhea, sinus pressure and sore throat.   Respiratory: Positive for cough, shortness of breath and wheezing. Negative for hemoptysis.   Cardiovascular: Negative for chest pain.  Gastrointestinal: Negative for heartburn, nausea, vomiting and diarrhea.  Musculoskeletal: Negative for myalgias.  Skin: Negative for rash.  Allergic/Immunologic: Positive for environmental allergies.  Neurological: Negative for syncope and headaches.  All other systems reviewed and are negative.  Past Medical History  Diagnosis Date  . Asthma   . Allergic rhinitis, cause unspecified   . Anxiety   . Migraine headache     History   Social History  . Marital Status: Married    Spouse Name: N/A    Number of Children: N/A  . Years of Education: N/A   Occupational History  . Not on file.   Social History Main Topics  . Smoking status: Current Some Day Smoker -- 0.20 packs/day for 15 years   Types: Cigarettes  . Smokeless tobacco: Not on file  . Alcohol Use: Yes  . Drug Use: No  . Sexual Activity: Not on file   Other Topics Concern  . Not on file   Social History Narrative  . No narrative on file    History reviewed. No pertinent past surgical history.  No family history on file.  Allergies  Allergen Reactions  . Neomycin-Bacitracin Zn-Polymyx     Current Outpatient Prescriptions on File Prior to Visit  Medication Sig Dispense Refill  . albuterol (PROVENTIL HFA) 108 (90 BASE) MCG/ACT inhaler Inhale 2 puffs into the lungs every 4 (four) hours as needed for wheezing or shortness of breath.  3 Inhaler  3  . azelastine (ASTELIN) 137 MCG/SPRAY nasal spray Place 1 spray into both nostrils 2 (two) times daily. Use in each nostril as directed  90 mL  3  . diphenhydrAMINE (BENADRYL) 25 MG tablet Take 25 mg by mouth every 6 (six) hours as needed.      . fluticasone (FLONASE) 50 MCG/ACT nasal spray 1-2 sprays each nostril at bedtime  16 g  prn  . mometasone-formoterol (DULERA) 100-5 MCG/ACT AERO 2 puffs then rinse mouth, twice daily  3 Inhaler  3  . mometasone-formoterol (DULERA) 200-5 MCG/ACT AERO Inhale 2 puffs into the lungs 2 (two) times daily.  1 Inhaler  0  . montelukast (SINGULAIR) 10 MG tablet Take 1 tablet (10 mg total) by mouth at bedtime.  90 tablet  3  . fexofenadine (ALLEGRA) 180 MG tablet Take 180 mg by mouth daily.       No current facility-administered medications on file prior to visit.  EXAM: BP 128/82  Pulse 107  Temp(Src) 98.9 F (37.2 C) (Oral)  Resp 20  Wt 149 lb (67.586 kg)  SpO2 96%     Objective:   Physical Exam  Nursing note and vitals reviewed. Constitutional: She is oriented to person, place, and time. She appears well-developed and well-nourished. No distress.  HENT:  Head: Normocephalic and atraumatic.  Right Ear: External ear normal.  Left Ear: External ear normal.  Nose: Nose normal.  Mouth/Throat: Oropharynx is clear and  moist. No oropharyngeal exudate.  Eyes: Conjunctivae and EOM are normal. Pupils are equal, round, and reactive to light.  Neck: Normal range of motion. Neck supple.  Cardiovascular: Normal rate, regular rhythm and intact distal pulses.   Pulmonary/Chest: Effort normal. No stridor. No respiratory distress. She has wheezes (bilateral, improved after neb.). She has no rales. She exhibits no tenderness.  Lymphadenopathy:    She has no cervical adenopathy.  Neurological: She is alert and oriented to person, place, and time.  Skin: Skin is warm and dry. No rash noted. She is not diaphoretic. No erythema. No pallor.  Psychiatric: She has a normal mood and affect. Her behavior is normal. Judgment and thought content normal.    Lab Results  Component Value Date   WBC 10.5 12/12/2012   HGB 14.0 12/12/2012   HCT 40.0 12/12/2012   PLT 264.0 12/12/2012   GLUCOSE 97 12/12/2012   CHOL 146 02/25/2009   TRIG 54 02/25/2009   HDL 61.9 02/25/2009   LDLCALC 73 02/25/2009   ALT 14 12/12/2012   AST 18 12/12/2012   NA 141 12/12/2012   K 4.9 12/12/2012   CL 108 12/12/2012   CREATININE 0.9 12/12/2012   BUN 12 12/12/2012   CO2 26 12/12/2012   TSH 2.45 02/25/2009         Assessment & Plan:  Alexis Hodges was seen today for bronchitis.  Diagnoses and associated orders for this visit:  Wheezing - ipratropium-albuterol (DUONEB) 0.5-2.5 (3) MG/3ML nebulizer solution 3 mL; Take 3 mLs by nebulization once. - predniSONE (DELTASONE) 20 MG tablet; Take 1 tablet (20 mg total) by mouth 2 (two) times daily with a meal.  Asthmatic bronchitis with acute exacerbation Comments: Wheezing and SOB, Improved with Neb in office. Rx prednisone course and hycodan for cough. - predniSONE (DELTASONE) 20 MG tablet; Take 1 tablet (20 mg total) by mouth 2 (two) times daily with a meal. - HYDROcodone-homatropine (HYCODAN) 5-1.5 MG/5ML syrup; Take 5 mLs by mouth at bedtime.    Continue all current asthma and allergy medications.  No  driving while taking Hycodan.  Return precautions provided, and patient handout on asthma acute exacerbation.  Plan to follow up as needed, or for worsening or persistent symptoms despite treatment.  Patient Instructions  Continue your allergy and asthma medications.  Will we add a short course of prednisone to help decrease your asthma symptoms.  Hycodan at bedtime as needed for cough. NO DRIVING WHILE TAKING THIS MEDICATION AS IT WILL INHIBIT YOUR ABILITY TO OPERATE A MOTOR VEHICLE.  If emergency symptoms discussed during visit developed, seek medical attention immediately.  Followup as needed, or for worsening or persistent symptoms despite treatment.

## 2014-07-24 NOTE — Patient Instructions (Addendum)
Continue your allergy and asthma medications.  Will we add a short course of prednisone to help decrease your asthma symptoms.  Hycodan at bedtime as needed for cough. NO DRIVING WHILE TAKING THIS MEDICATION AS IT WILL INHIBIT YOUR ABILITY TO OPERATE A MOTOR VEHICLE.  If emergency symptoms discussed during visit developed, seek medical attention immediately.  Followup as needed, or for worsening or persistent symptoms despite treatment.    Asthma, Acute Bronchospasm Acute bronchospasm caused by asthma is also referred to as an asthma attack. Bronchospasm means your air passages become narrowed. The narrowing is caused by inflammation and tightening of the muscles in the air tubes (bronchi) in your lungs. This can make it hard to breathe or cause you to wheeze and cough. CAUSES Possible triggers are:  Animal dander from the skin, hair, or feathers of animals.  Dust mites contained in house dust.  Cockroaches.  Pollen from trees or grass.  Mold.  Cigarette or tobacco smoke.  Air pollutants such as dust, household cleaners, hair sprays, aerosol sprays, paint fumes, strong chemicals, or strong odors.  Cold air or weather changes. Cold air may trigger inflammation. Winds increase molds and pollens in the air.  Strong emotions such as crying or laughing hard.  Stress.  Certain medicines such as aspirin or beta-blockers.  Sulfites in foods and drinks, such as dried fruits and wine.  Infections or inflammatory conditions, such as a flu, cold, or inflammation of the nasal membranes (rhinitis).  Gastroesophageal reflux disease (GERD). GERD is a condition where stomach acid backs up into your esophagus.  Exercise or strenuous activity. SIGNS AND SYMPTOMS   Wheezing.  Excessive coughing, particularly at night.  Chest tightness.  Shortness of breath. DIAGNOSIS  Your health care provider will ask you about your medical history and perform a physical exam. A chest X-ray or  blood testing may be performed to look for other causes of your symptoms or other conditions that may have triggered your asthma attack. TREATMENT  Treatment is aimed at reducing inflammation and opening up the airways in your lungs. Most asthma attacks are treated with inhaled medicines. These include quick relief or rescue medicines (such as bronchodilators) and controller medicines (such as inhaled corticosteroids). These medicines are sometimes given through an inhaler or a nebulizer. Systemic steroid medicine taken by mouth or given through an IV tube also can be used to reduce the inflammation when an attack is moderate or severe. Antibiotic medicines are only used if a bacterial infection is present.  HOME CARE INSTRUCTIONS   Rest.  Drink plenty of liquids. This helps the mucus to remain thin and be easily coughed up. Only use caffeine in moderation and do not use alcohol until you have recovered from your illness.  Do not smoke. Avoid being exposed to secondhand smoke.  You play a critical role in keeping yourself in good health. Avoid exposure to things that cause you to wheeze or to have breathing problems.  Keep your medicines up-to-date and available. Carefully follow your health care provider's treatment plan.  Take your medicine exactly as prescribed.  When pollen or pollution is bad, keep windows closed and use an air conditioner or go to places with air conditioning.  Asthma requires careful medical care. See your health care provider for a follow-up as advised. If you are more than [redacted] weeks pregnant and you were prescribed any new medicines, let your obstetrician know about the visit and how you are doing. Follow up with your health care provider  as directed.  After you have recovered from your asthma attack, make an appointment with your outpatient doctor to talk about ways to reduce the likelihood of future attacks. If you do not have a doctor who manages your asthma, make  an appointment with a primary care doctor to discuss your asthma. SEEK IMMEDIATE MEDICAL CARE IF:   You are getting worse.  You have trouble breathing. If severe, call your local emergency services (911 in the [place]  You develop chest pain or discomfort.  You are vomiting.  You are not able to keep fluids down.  You are coughing up yellow, green, brown, or bloody sputum.  You have a fever and your symptoms suddenly get worse.  You have trouble swallowing. MAKE SURE YOU:   Understand these instructions.  Will watch your condition.  Will get help right away if you are not doing well or get worse. Document Released: 03/28/2007 Document Revised: 12/16/2013 Document Reviewed: 06/18/2013 [facility] Patient Information 2015 [place], [place]. This information is not intended to replace advice given to you by your health care provider. Make sure you discuss any questions you have with your health care provider.

## 2014-10-05 ENCOUNTER — Encounter: Payer: Self-pay | Admitting: Internal Medicine

## 2014-10-05 ENCOUNTER — Ambulatory Visit: Payer: Managed Care, Other (non HMO) | Admitting: Internal Medicine

## 2014-10-05 VITALS — BP 150/88 | HR 104 | Ht 64.0 in | Wt 152.4 lb

## 2014-10-05 DIAGNOSIS — Z23 Encounter for immunization: Secondary | ICD-10-CM

## 2014-10-05 MED ORDER — IPRATROPIUM BROMIDE 0.03 % NA SOLN: 2.0000 | Freq: Two times a day (BID) | NASAL | Status: AC

## 2014-10-05 MED ORDER — FLUTICASONE PROPIONATE 50 MCG/ACT NA SUSP: NASAL | Status: AC

## 2014-10-05 MED ORDER — ALBUTEROL SULFATE HFA 108 (90 BASE) MCG/ACT IN AERS: 2.0000 | INHALATION_SPRAY | RESPIRATORY_TRACT | Status: DC | PRN

## 2014-10-05 MED ORDER — MOMETASONE FURO-FORMOTEROL FUM 200-5 MCG/ACT IN AERO: 2.0000 | INHALATION_SPRAY | Freq: Two times a day (BID) | RESPIRATORY_TRACT | Status: DC

## 2014-10-05 MED ORDER — MONTELUKAST SODIUM 10 MG PO TABS: 10.0000 mg | ORAL_TABLET | Freq: Every day | ORAL | Status: AC

## 2014-10-05 NOTE — Patient Instructions (Signed)
[facility]  Instead of astelin try ipratropium nasal spray (script sent)  Flu shot  Don't forget that you would really like to stop smoking !

## 2014-10-05 NOTE — Progress Notes (Signed)
10/18/12- [age] yoF smoker referred courtesy of Dr Felicity CoyerLeschber for allergy evaluation, with hx of allergic rhinitis, asthma. She admits to smoking one or 2 cigarettes "some days". She gives a history of perennial rhinitis symptoms with postnasal drip since childhood. Triggers have included house dust, fall leaves, perfumes. Grass causes hives Now worse since early September. Using her rescue inhaler more than one time per day. Usually asthma is controlled with just Singulair. Never emergency room for asthma. Has never felt allergies were dangerous and has not had an EpiPen. House with basement, pet rabbit, husband smokes and she smokes occasionally. No mold.  12/04/12- [age] yoF smoker referred courtesy of Dr Felicity CoyerLeschber for allergy evaluation, with hx of allergic rhinitis, asthma. FOLLOWS FOR: allergies are better than in the fall; never goes away. Asthma is controlled with Dulera, one puff once daily. Continue Singulair. Rescue inhaler once or twice a week. Allegra if needed. Allergy Profile-10/18/2012-total IgE 1393 with significant elevations for most allergens on the panel. This is expected with a total IgE this high. Particular elevation for cat. Rabbit was also elevated and she still has a rabbit in the house. I recommended it be removed. PFT: 11/11/2012-moderate obstructive airways disease with response to bronchodilator. Normal lung volumes, normal diffusion. FEV1 3.06/105%, FEV1/FVC 0.65. Significant response to bronchodilator. TLC 112%, DLCO 101%.  04/10/13-  [age] yoF smoker referred courtesy of Dr Felicity CoyerLeschber for allergy evaluation, with hx of allergic rhinitis, asthma/ COPD. FOLLOWS FOR: Feels like her medication needs to be changed. Has had an issue with Flonase and making her sinuses bleed. Elwin SleightDulera is getting expensive and would like to change. Recently had a cold. Pollen season has not been bad. Like Dymista nasal spray but because of cost asks trial of Astelin. [facility]  cost.  10/10/13- 37 yoF smoker referred courtesy of Dr Felicity CoyerLeschber for allergy evaluation, with hx of allergic rhinitis, asthma/ COPD. FOLLOWS VHQ:IONGEXBMFOR:pressure in sinus area; drainage;? ear infection. Watery rhinorhea, using Astelin and Flonase. Much less sneeze. Smoking less.   05/04/14- 37 yoF smoker referred courtesy of Dr Felicity CoyerLeschber for allergy evaluation, with hx of allergic rhinitis, asthma/ COPD. FOLLOWS FOR: itchy ears, throat, and eyes. "frog" in throat. Wheezing more often. Has to use Dulera more than what she is used to.  Caught a cold after visiting [place] in early April, treated with antibiotic and low-dose prednisone. Flared again with pollen season mid/late April. Much better now but eyes still itch. Throat feels irritated. No headache, purulent discharge or fever. Taking Allegra plus Benadryl.  10/05/14- 37 yoF smoker referred courtesy of Dr Felicity CoyerLeschber for allergy evaluation, with hx of allergic rhinitis, asthma/ COPD. FOLLOW FOR:  Seasonal Allergies; not sure that nasal spray is as effective as it used to be or if allergies are worse, she is sneezing a lot  CXR 05/04/14 IMPRESSION:  No active cardiopulmonary disease.  Electronically Signed  By: Rise MuBenjamin McClintock M.D.  On: 05/04/2014 20:57    ROS-see HPI Constitutional:   No-   weight loss, night sweats, fevers, chills, fatigue, lassitude. HEENT:   No-  headaches, difficulty swallowing, tooth/dental problems, sore throat,       No-sneezing,+ itching, ear ache, nasal congestion, +post nasal drip,  CV:  No-   chest pain, orthopnea, PND, swelling in lower extremities, anasarca,  dizziness, palpitations Resp: No-   shortness of breath with exertion or at rest.              No-   productive cough,  +  non-productive cough,  No- coughing up of blood.              No-   change in color of mucus. No- wheezing.   Skin:  + hives GI:  No-   heartburn, indigestion, abdominal pain, nausea, vomiting, GU:  MS:  No-   joint pain or swelling.    Neuro-     nothing unusual Psych:  No- change in mood or affect. No depression or anxiety.  No memory loss.  OBJ- Physical Exam General- Alert, Oriented, Affect-appropriate, Distress- none acute Skin- rash-none, lesions- none, excoriation- none Lymphadenopathy- none Head- atraumatic            Eyes- Gross vision intact, PERRLA, conjunctivae and secretions clear            Ears- Hearing, canals-normal            Nose- clear, no-Septal dev, mucus, polyps, erosion, perforation             Throat- Mallampati II , mucosa clear , drainage- none, tonsils- atrophic. + Tongue stud Neck- flexible , trachea midline, no stridor , thyroid nl, carotid no bruit Chest - symmetrical excursion , unlabored           Heart/CV- RRR , no murmur , no gallop  , no rub, nl s1 s2                           - JVD- none , edema- none, stasis changes- none, varices- none           Lung- clear to P&A,  Wheeze-none, cough+dry , dullness-none, rub- none           Chest wall-  Abd-  Br/ Gen/ Rectal- Not done, not indicated Extrem- cyanosis- none, clubbing, none, atrophy- none, strength- nl Neuro- grossly intact to observation

## 2014-10-08 ENCOUNTER — Telehealth: Payer: Self-pay | Admitting: Internal Medicine

## 2014-10-08 MED ORDER — AZITHROMYCIN 250 MG PO TABS: ORAL_TABLET | ORAL | Status: DC

## 2014-10-08 NOTE — Telephone Encounter (Signed)
Pt c/o thick yellow mucus, increase in quantity, cough, wheezing and SOB x 1 day. Pt reports the cough is productive at all time. Using Sudafed Expectorant this AM Allergies  Allergen Reactions  . Neomycin-Bacitracin Zn-Polymyx    [facility] [address]  Please advise Dr Maple HudsonYoung. Thanks.  Current Outpatient Prescriptions on File Prior to Visit  Medication Sig Dispense Refill  . albuterol (PROVENTIL HFA) 108 (90 BASE) MCG/ACT inhaler Inhale 2 puffs into the lungs every 4 (four) hours as needed for wheezing or shortness of breath.  3 Inhaler  3  . azelastine (ASTELIN) 137 MCG/SPRAY nasal spray Place 1 spray into both nostrils 2 (two) times daily. Use in each nostril as directed  90 mL  3  . diphenhydrAMINE (BENADRYL) 25 MG tablet Take 25 mg by mouth every 6 (six) hours as needed.      . fexofenadine (ALLEGRA) 180 MG tablet Take 180 mg by mouth daily.      . fluticasone (FLONASE) 50 MCG/ACT nasal spray 1-2 sprays each nostril at bedtime  48 g  3  . ipratropium (ATROVENT) 0.03 % nasal spray Place 2 sprays into both nostrils every 12 (twelve) hours.  90 mL  3  . mometasone-formoterol (DULERA) 200-5 MCG/ACT AERO Inhale 2 puffs into the lungs 2 (two) times daily.  1 Inhaler  0  . montelukast (SINGULAIR) 10 MG tablet Take 1 tablet (10 mg total) by mouth at bedtime.  90 tablet  3   No current facility-administered medications on file prior to visit.

## 2014-10-08 NOTE — Telephone Encounter (Signed)
Called pt. Aware of recs. RX called in. Nothing further needed

## 2014-10-08 NOTE — Telephone Encounter (Signed)
Offer Zpak 

## 2014-10-16 ENCOUNTER — Other Ambulatory Visit: Payer: Self-pay | Admitting: Internal Medicine

## 2014-10-16 ENCOUNTER — Telehealth: Payer: Self-pay | Admitting: Internal Medicine

## 2014-10-16 MED ORDER — PREDNISONE 20 MG PO TABS: 20.0000 mg | ORAL_TABLET | Freq: Every day | ORAL | Status: DC

## 2014-10-16 MED ORDER — ALBUTEROL SULFATE HFA 108 (90 BASE) MCG/ACT IN AERS: 2.0000 | INHALATION_SPRAY | RESPIRATORY_TRACT | Status: AC | PRN

## 2014-10-16 NOTE — Telephone Encounter (Signed)
Offer prednisone 20 mg daily x 3 days. If not better by first of week, then let us know. If she gets really uncomfortable over week end, then advise she go to ER.

## 2014-10-16 NOTE — Telephone Encounter (Signed)
Spoke with the pt and notified of recs per CDY  She verbalized understanding  Nothing further needed  Rx was sent to pharm  

## 2014-10-16 NOTE — Telephone Encounter (Signed)
Last OV 10-05-14. Pt called in on 10-08-14 c/o having increased SOB, wheezing, and productive cough with yellow phlegm. She was prescribed a zpak at that time. Pt states she has completed this course x 3 days ago. She states the productive cough is much better, she is no longer coughing up yellow mucus. She states however, that she is having increased SOB with any activity and wheezing. She states overall she does not feel sick, but "just can't breathe." Please advise. Alexis CurieJennifer Hodges, CMA Allergies  Allergen Reactions  . Neomycin-Bacitracin Zn-Polymyx    Current Outpatient Prescriptions on File Prior to Visit  Medication Sig Dispense Refill  . albuterol (PROVENTIL HFA) 108 (90 BASE) MCG/ACT inhaler Inhale 2 puffs into the lungs every 4 (four) hours as needed for wheezing or shortness of breath.  3 Inhaler  3  . azelastine (ASTELIN) 137 MCG/SPRAY nasal spray Place 1 spray into both nostrils 2 (two) times daily. Use in each nostril as directed  90 mL  3  . azithromycin (ZITHROMAX) 250 MG tablet Take as directed  6 tablet  0  . diphenhydrAMINE (BENADRYL) 25 MG tablet Take 25 mg by mouth every 6 (six) hours as needed.      . fexofenadine (ALLEGRA) 180 MG tablet Take 180 mg by mouth daily.      . fluticasone (FLONASE) 50 MCG/ACT nasal spray 1-2 sprays each nostril at bedtime  48 g  3  . ipratropium (ATROVENT) 0.03 % nasal spray Place 2 sprays into both nostrils every 12 (twelve) hours.  90 mL  3  . mometasone-formoterol (DULERA) 200-5 MCG/ACT AERO Inhale 2 puffs into the lungs 2 (two) times daily.  1 Inhaler  0  . montelukast (SINGULAIR) 10 MG tablet Take 1 tablet (10 mg total) by mouth at bedtime.  90 tablet  3   No current facility-administered medications on file prior to visit.

## 2014-10-16 NOTE — Telephone Encounter (Signed)
[facility] requesting change from Proventil ($36.83) to Proair ($28.89) [facility] by [facility] and [facility]ed back to [facility] @ [phone] #3 w/ [id number] refills Med list updated Fax sent for scan Nothing further needed; will sign off.

## 2015-03-08 ENCOUNTER — Encounter: Payer: Self-pay | Admitting: Family

## 2015-03-08 ENCOUNTER — Ambulatory Visit (INDEPENDENT_AMBULATORY_CARE_PROVIDER_SITE_OTHER): Payer: Managed Care, Other (non HMO) | Admitting: Family

## 2015-03-08 VITALS — BP 130/92 | HR 136 | Temp 98.1°F | Resp 20 | Ht 64.0 in | Wt 149.0 lb

## 2015-03-08 DIAGNOSIS — J45909 Unspecified asthma, uncomplicated: Secondary | ICD-10-CM | POA: Insufficient documentation

## 2015-03-08 DIAGNOSIS — J4541 Moderate persistent asthma with (acute) exacerbation: Secondary | ICD-10-CM

## 2015-03-08 MED ORDER — METHYLPREDNISOLONE ACETATE 80 MG/ML IJ SUSP
120.0000 mg | Freq: Once | INTRAMUSCULAR | Status: AC
  Administered 2015-03-08: 120 mg via INTRAMUSCULAR

## 2015-03-08 MED ORDER — PREDNISONE 20 MG PO TABS: 20.0000 mg | ORAL_TABLET | Freq: Every day | ORAL | Status: DC

## 2015-03-08 MED ORDER — ALBUTEROL SULFATE (2.5 MG/3ML) 0.083% IN NEBU
5.0000 mg | INHALATION_SOLUTION | Freq: Once | RESPIRATORY_TRACT | Status: AC
  Administered 2015-03-08: 5 mg via RESPIRATORY_TRACT

## 2015-03-08 MED ORDER — LEVOFLOXACIN 500 MG PO TABS: 500.0000 mg | ORAL_TABLET | Freq: Every day | ORAL | Status: DC

## 2015-03-08 NOTE — Patient Instructions (Signed)
Thank you for choosing [facility].  Summary/Instructions:  Your prescription(s) have been submitted to your pharmacy or been printed and provided for you. Please take as directed and contact our office if you believe you are having problem(s) with the medication(s) or have any questions.  If your symptoms worsen or fail to improve, please contact our office for further instruction, or in case of emergency go directly to the emergency room at the closest medical facility.    General Recommendations:    Please drink plenty of fluids.  Get plenty of rest   Sleep in humidified air  Use saline nasal sprays  Netti pot   OTC Medications:  Decongestants - helps relieve congestion   Flonase (generic fluticasone) or Nasacort (generic triamcinolone) - please make sure to use the "cross-over" technique at a 45 degree angle towards the opposite eye as opposed to straight up the nasal passageway.   Sudafed (generic pseudoephedrine - Note this is the one that is available behind the pharmacy counter); Products with phenylephrine (-PE) may also be used but is often not as effective as pseudoephedrine.   If you have HIGH BLOOD PRESSURE - Coricidin HBP; AVOID any product that is -D as this contains pseudoephedrine which may increase your blood pressure.  Afrin (oxymetazoline) every 6-8 hours for up to 3 days.   Allergies - helps relieve runny nose, itchy eyes and sneezing   Claritin (generic loratidine), Allegra (fexofenidine), or Zyrtec (generic cyrterizine) for runny nose. These medications should not cause drowsiness.  Note - Benadryl (generic diphenhydramine) may be used however may cause drowsiness  Cough -   Delsym or Robitussin (generic dextromethorphan)  Expectorants - helps loosen mucus to ease removal   Mucinex (generic guaifenesin) as directed on the package.  Headaches / General Aches   Tylenol (generic acetaminophen) - DO NOT EXCEED 3 grams (3,000 mg) in a 24  hour time period  Advil/Motrin (generic ibuprofen)   Sore Throat -   Salt water gargle   Chloraseptic (generic benzocaine) spray or lozenges / Sucrets (generic dyclonine)   Acute Bronchitis Bronchitis is inflammation of the airways that extend from the windpipe into the lungs (bronchi). The inflammation often causes mucus to develop. This leads to a cough, which is the most common symptom of bronchitis.  In acute bronchitis, the condition usually develops suddenly and goes away over time, usually in a couple weeks. Smoking, allergies, and asthma can make bronchitis worse. Repeated episodes of bronchitis may cause further lung problems.  CAUSES Acute bronchitis is most often caused by the same virus that causes a cold. The virus can spread from person to person (contagious) through coughing, sneezing, and touching contaminated objects. SIGNS AND SYMPTOMS   Cough.   Fever.   Coughing up mucus.   Body aches.   Chest congestion.   Chills.   Shortness of breath.   Sore throat.  DIAGNOSIS  Acute bronchitis is usually diagnosed through a physical exam. Your health care provider will also ask you questions about your medical history. Tests, such as chest X-rays, are sometimes done to rule out other conditions.  TREATMENT  Acute bronchitis usually goes away in a couple weeks. Oftentimes, no medical treatment is necessary. Medicines are sometimes given for relief of fever or cough. Antibiotic medicines are usually not needed but may be prescribed in certain situations. In some cases, an inhaler may be recommended to help reduce shortness of breath and control the cough. A cool mist vaporizer may also be used to help   thin bronchial secretions and make it easier to clear the chest.  HOME CARE INSTRUCTIONS  Get plenty of rest.   Drink enough fluids to keep your urine clear or pale yellow (unless you have a medical condition that requires fluid restriction). Increasing fluids may  help thin your respiratory secretions (sputum) and reduce chest congestion, and it will prevent dehydration.   Take medicines only as directed by your health care provider.  If you were prescribed an antibiotic medicine, finish it all even if you start to feel better.  Avoid smoking and secondhand smoke. Exposure to cigarette smoke or irritating chemicals will make bronchitis worse. If you are a smoker, consider using nicotine gum or skin patches to help control withdrawal symptoms. Quitting smoking will help your lungs heal faster.   Reduce the chances of another bout of acute bronchitis by washing your hands frequently, avoiding people with cold symptoms, and trying not to touch your hands to your mouth, nose, or eyes.   Keep all follow-up visits as directed by your health care provider.  SEEK MEDICAL CARE IF: Your symptoms do not improve after 1 week of treatment.  SEEK IMMEDIATE MEDICAL CARE IF:  You develop an increased fever or chills.   You have chest pain.   You have severe shortness of breath.  You have bloody sputum.   You develop dehydration.  You faint or repeatedly feel like you are going to pass out.  You develop repeated vomiting.  You develop a severe headache. MAKE SURE YOU:   Understand these instructions.  Will watch your condition.  Will get help right away if you are not doing well or get worse. Document Released: 01/18/2005 Document Revised: 04/27/2014 Document Reviewed: 06/03/2013 ExitCare Patient Information 2015 [employer]. This information is not intended to replace advice given to you by your health care provider. Make sure you discuss any questions you have with your health care provider.    

## 2015-03-08 NOTE — Assessment & Plan Note (Addendum)
Symptoms and exam consistent with asthmatic bronchitis. Start Levaquin. Start prednisone. In office breathing treatment and steroid injection provided to patient. Patient tolerated well and indicates improved symptoms. Remains with residual wheezing. Continue over-the-counter medications as needed for symptom relief and supportive care. Continue previously prescribed dosages of albuterol, Singulair, and Dulera. Follow-up if symptoms worsen or fail to improve.

## 2015-03-08 NOTE — Progress Notes (Signed)
   Subjective:    Patient ID: Alexis Hodges, female    DOB: 10-29-1976, 39 y.o.   MRN: [id number]  Chief Complaint  Patient presents with  . Cough    x1 week and a half, wheezing, coughing, sinus pressure    HPI:  Alexis Hodges is a 39 y.o. female who presents today for an acute visit.   This is a new problem. Associated symptoms of wheezing, coughing and sinus pressure have been going on for about 1.5 weeks. Has been using her asthma medication and her rescue inhaler which have helped some. Denies any recent antibiotic use.    Allergies  Allergen Reactions  . Neomycin-Bacitracin Zn-Polymyx     Current Outpatient Prescriptions on File Prior to Visit  Medication Sig Dispense Refill  . albuterol (PROAIR HFA) 108 (90 BASE) MCG/ACT inhaler Inhale 2 puffs into the lungs every 4 (four) hours as needed for wheezing or shortness of breath. 3 Inhaler 4  . diphenhydrAMINE (BENADRYL) 25 MG tablet Take 25 mg by mouth every 6 (six) hours as needed.    . fluticasone (FLONASE) 50 MCG/ACT nasal spray 1-2 sprays each nostril at bedtime 48 g 3  . ipratropium (ATROVENT) 0.03 % nasal spray Place 2 sprays into both nostrils every 12 (twelve) hours. 90 mL 3  . mometasone-formoterol (DULERA) 200-5 MCG/ACT AERO Inhale 2 puffs into the lungs 2 (two) times daily. 1 Inhaler 0  . montelukast (SINGULAIR) 10 MG tablet Take 1 tablet (10 mg total) by mouth at bedtime. 90 tablet 3  . fexofenadine (ALLEGRA) 180 MG tablet Take 180 mg by mouth daily.     No current facility-administered medications on file prior to visit.    Review of Systems  Constitutional: Negative for fever and chills.  HENT: Positive for congestion and sinus pressure. Negative for sore throat.   Respiratory: Positive for cough, chest tightness, shortness of breath and wheezing.   Neurological: Positive for headaches.      Objective:    BP 130/92 mmHg  Pulse 136  Temp(Src) 98.1 F (36.7 C) (Oral)  Resp 20  Ht 5\' 4"  (1.626 m)  Wt  149 lb (67.586 kg)  BMI 25.56 kg/m2  SpO2 98% Nursing note and vital signs reviewed.  Physical Exam  Constitutional: She is oriented to person, place, and time. She appears well-developed and well-nourished. No distress.  Cardiovascular: Normal rate, regular rhythm, normal heart sounds and intact distal pulses.   Pulmonary/Chest: Effort normal. She has wheezes.  Neurological: She is alert and oriented to person, place, and time.  Skin: Skin is warm and dry.  Psychiatric: She has a normal mood and affect. Her behavior is normal. Judgment and thought content normal.       Assessment & Plan:

## 2015-03-08 NOTE — Progress Notes (Signed)
Pre visit review using our clinic review tool, if applicable. No additional management support is needed unless otherwise documented below in the visit note. 

## 2015-03-09 ENCOUNTER — Telehealth: Payer: Self-pay | Admitting: Internal Medicine

## 2015-03-09 NOTE — Telephone Encounter (Signed)
emmi emailed °

## 2015-04-06 ENCOUNTER — Encounter: Payer: Self-pay | Admitting: Internal Medicine

## 2015-04-06 ENCOUNTER — Ambulatory Visit: Payer: Managed Care, Other (non HMO) | Admitting: Internal Medicine

## 2015-04-06 ENCOUNTER — Ambulatory Visit (INDEPENDENT_AMBULATORY_CARE_PROVIDER_SITE_OTHER): Payer: Managed Care, Other (non HMO) | Admitting: Internal Medicine

## 2015-04-06 VITALS — BP 116/62 | HR 78 | Ht 64.0 in | Wt 147.8 lb

## 2015-04-06 DIAGNOSIS — J45998 Other asthma: Secondary | ICD-10-CM

## 2015-04-06 DIAGNOSIS — J309 Allergic rhinitis, unspecified: Secondary | ICD-10-CM

## 2015-04-06 DIAGNOSIS — J302 Other seasonal allergic rhinitis: Secondary | ICD-10-CM

## 2015-04-06 DIAGNOSIS — J3089 Other allergic rhinitis: Principal | ICD-10-CM

## 2015-04-06 NOTE — Patient Instructions (Signed)
Sample Anoro inhaler    1 puff, once daily     Try this instead of Dulera. When you run out, go [facility] your [facility] and see if there is much difference  Please call if we can help

## 2015-04-06 NOTE — Assessment & Plan Note (Signed)
Fair control for spring season now. Neti pot and antihistamines help, supplemented with nasal sprays as corrected.

## 2015-04-06 NOTE — Progress Notes (Signed)
10/18/12- [age] yoF smoker referred courtesy of Dr Felicity CoyerLeschber for allergy evaluation, with hx of allergic rhinitis, asthma. She admits to smoking one or 2 cigarettes "some days". She gives a history of perennial rhinitis symptoms with postnasal drip since childhood. Triggers have included house dust, fall leaves, perfumes. Grass causes hives Now worse since early September. Using her rescue inhaler more than one time per day. Usually asthma is controlled with just Singulair. Never emergency room for asthma. Has never felt allergies were dangerous and has not had an EpiPen. House with basement, pet rabbit, husband smokes and she smokes occasionally. No mold.  12/04/12- [age] yoF smoker referred courtesy of Dr Felicity CoyerLeschber for allergy evaluation, with hx of allergic rhinitis, asthma. FOLLOWS FOR: allergies are better than in the fall; never goes away. Asthma is controlled with Dulera, one puff once daily. Continue Singulair. Rescue inhaler once or twice a week. Allegra if needed. Allergy Profile-10/18/2012-total IgE 1393 with significant elevations for most allergens on the panel. This is expected with a total IgE this high. Particular elevation for cat. Rabbit was also elevated and she still has a rabbit in the house. I recommended it be removed. PFT: 11/11/2012-moderate obstructive airways disease with response to bronchodilator. Normal lung volumes, normal diffusion. FEV1 3.06/105%, FEV1/FVC 0.65. Significant response to bronchodilator. TLC 112%, DLCO 101%.  04/10/13-  [age] yoF smoker referred courtesy of Dr Felicity CoyerLeschber for allergy evaluation, with hx of allergic rhinitis, asthma/ COPD. FOLLOWS FOR: Feels like her medication needs to be changed. Has had an issue with Flonase and making her sinuses bleed. [facility] is getting expensive and would like to change. Recently had a cold. Pollen season has not been bad. Like Dymista nasal spray but because of cost asks trial of Astelin. [facility] one time daily to minimize  cost.  10/10/13- 37 yoF smoker referred courtesy of Dr Felicity CoyerLeschber for allergy evaluation, with hx of allergic rhinitis, asthma/ COPD. FOLLOWS ZOX:[id number]:pressure in sinus area; drainage;? ear infection. Watery rhinorhea, using Astelin and Flonase. Much less sneeze. Smoking less.   05/04/14- 37 yoF smoker referred courtesy of Dr Felicity CoyerLeschber for allergy evaluation, with hx of allergic rhinitis, asthma/ COPD. FOLLOWS FOR: itchy ears, throat, and eyes. "frog" in throat. Wheezing more often. Has to use Dulera more than what she is used to.  [place] early [place], treated with antibiotic and low-dose prednisone. Flared again with pollen season mid/late April. Much better now but eyes still itch. Throat feels irritated. No headache, purulent discharge or fever. Taking Allegra plus Benadryl.  10/05/14- 37 yoF smoker referred courtesy of Dr Felicity CoyerLeschber for allergy evaluation, with hx of allergic rhinitis, asthma/ COPD. FOLLOW FOR:  Seasonal Allergies; not sure that nasal spray is as effective as it used to be or if allergies are worse, she is sneezing a lot CXR 05/04/14 IMPRESSION:  No active cardiopulmonary disease.  Electronically Signed  By: Rise MuBenjamin McClintock M.D.  On: 05/04/2014 20:57   04/06/15- [age] yoF smoker referred courtesy of Dr Felicity CoyerLeschber for allergy evaluation, with hx of allergic rhinitis, asthma/ COPD. FOLLOWS FOR: Pt states allergies are pretty well maintained this year. Admits to smoking about 2 cigarettes daily now-doing better.  Likes Neti pot for helping with her nasal congestion.  ROS-see HPI Constitutional:   No-   weight loss, night sweats, fevers, chills, fatigue, lassitude. HEENT:   No-  headaches, difficulty swallowing, tooth/dental problems, sore throat,       No-sneezing,+ itching, ear ache, +nasal congestion, +post nasal drip,  CV:  No-   chest pain, orthopnea, PND, swelling in lower extremities, anasarca,  dizziness, palpitations Resp: No-   shortness of  breath with exertion or at rest.              No-   productive cough,  + non-productive cough,  No- coughing up of blood.              No-   change in color of mucus. No- wheezing.   Skin:  + hives GI:  No-   heartburn, indigestion, abdominal pain, nausea, vomiting, GU:  MS:  No-   joint pain or swelling.   Neuro-     nothing unusual Psych:  No- change in mood or affect. No depression or anxiety.  No memory loss.  OBJ- Physical Exam General- Alert, Oriented, Affect-appropriate, Distress- none acute Skin- rash-none, lesions- none, excoriation- none Lymphadenopathy- none Head- atraumatic            Eyes- Gross vision intact, PERRLA, conjunctivae and secretions clear            Ears- Hearing, canals-normal            Nose- clear, no-Septal dev, mucus, polyps, erosion, perforation             Throat- Mallampati II , mucosa clear , drainage- none, tonsils- atrophic. + Tongue stud, + throat clearing Neck- flexible , trachea midline, no stridor , thyroid nl, carotid no bruit Chest - symmetrical excursion , unlabored           Heart/CV- RRR , no murmur , no gallop  , no rub, nl s1 s2                           - JVD- none , edema- none, stasis changes- none, varices- none           Lung- clear to P&A,  wheeze + unlabored, cough-none , dullness-none, rub- none           Chest wall-  Abd-  Br/ Gen/ Rectal- Not done, not indicated Extrem- cyanosis- none, clubbing, none, atrophy- none, strength- nl Neuro- grossly intact to observation

## 2015-04-06 NOTE — Assessment & Plan Note (Signed)
She is wheezing more than I like, a time when she feels clear and at baseline. Plan-try Anoro inhaler instead of Dulera for comparison

## 2015-05-03 ENCOUNTER — Telehealth: Payer: Self-pay | Admitting: Internal Medicine

## 2015-05-03 MED ORDER — UMECLIDINIUM-VILANTEROL 62.5-25 MCG/INH IN AEPB: 1.0000 | INHALATION_SPRAY | Freq: Every day | RESPIRATORY_TRACT | Status: AC

## 2015-05-03 NOTE — Telephone Encounter (Signed)
Rx sent to pharmacy.  Pt aware. Nothing further needed.  

## 2015-05-03 NOTE — Telephone Encounter (Signed)
Ok to Rx Anoro inhaler # 1, 1 puff, once daily, refill prn

## 2015-05-03 NOTE — Telephone Encounter (Signed)
Per 04/06/15 OV: Patient Instructions       Sample Anoro inhaler    1 puff, once daily     Try this instead of Dulera. When you run out, [facility] and see if there is much difference Please call if we can help  --  Called spoke with pt. She is wanting RX anoro called in. She wants to try this for another month. Does feel this has helped her. Please advise CDY thanks

## 2015-05-03 NOTE — Telephone Encounter (Signed)
error 

## 2015-05-14 ENCOUNTER — Encounter: Payer: Self-pay | Admitting: Physician Assistant

## 2015-05-14 ENCOUNTER — Ambulatory Visit (INDEPENDENT_AMBULATORY_CARE_PROVIDER_SITE_OTHER): Payer: Managed Care, Other (non HMO) | Admitting: Physician Assistant

## 2015-05-14 VITALS — BP 126/76 | HR 86 | Temp 98.1°F | Resp 16 | Wt 150.0 lb

## 2015-05-14 DIAGNOSIS — J019 Acute sinusitis, unspecified: Secondary | ICD-10-CM | POA: Diagnosis not present

## 2015-05-14 DIAGNOSIS — B9689 Other specified bacterial agents as the cause of diseases classified elsewhere: Secondary | ICD-10-CM | POA: Insufficient documentation

## 2015-05-14 MED ORDER — AMOXICILLIN-POT CLAVULANATE 875-125 MG PO TABS
1.0000 | ORAL_TABLET | Freq: Two times a day (BID) | ORAL | Status: DC
Start: 2015-05-14 — End: 2015-06-20

## 2015-05-14 MED ORDER — METHYLPREDNISOLONE ACETATE 40 MG/ML IJ SUSP
40.0000 mg | Freq: Once | INTRAMUSCULAR | Status: AC
  Administered 2015-05-14: 40 mg via INTRAMUSCULAR

## 2015-05-14 NOTE — Progress Notes (Signed)
Pre visit review using our clinic review tool, if applicable. No additional management support is needed unless otherwise documented below in the visit note. 

## 2015-05-14 NOTE — Progress Notes (Signed)
Patient presents to clinic today c/o 1.5 weeks of sinus pressure, sinus pain, facial pain, PNG, cough and wheeze.  Patient denies fever, chills, chest pain, recent travel or sick contact.  Is an asthmatic.  Endorses Albuterol use twice daily over the past couple of days.  Past Medical History  Diagnosis Date  . Asthma   . Allergic rhinitis, cause unspecified   . Anxiety   . Migraine headache     Current Outpatient Prescriptions on File Prior to Visit  Medication Sig Dispense Refill  . albuterol (PROAIR HFA) 108 (90 BASE) MCG/ACT inhaler Inhale 2 puffs into the lungs every 4 (four) hours as needed for wheezing or shortness of breath. 3 Inhaler 4  . diphenhydrAMINE (BENADRYL) 25 MG tablet Take 25 mg by mouth every 6 (six) hours as needed.    . fluticasone (FLONASE) 50 MCG/ACT nasal spray 1-2 sprays each nostril at bedtime 48 g 3  . ipratropium (ATROVENT) 0.03 % nasal spray Place 2 sprays into both nostrils every 12 (twelve) hours. 90 mL 3  . montelukast (SINGULAIR) 10 MG tablet Take 1 tablet (10 mg total) by mouth at bedtime. 90 tablet 3  . Umeclidinium-Vilanterol (ANORO ELLIPTA) 62.5-25 MCG/INH AEPB Inhale 1 puff into the lungs daily. 60 each prn  . fexofenadine (ALLEGRA) 180 MG tablet Take 180 mg by mouth daily.     No current facility-administered medications on file prior to visit.    Allergies  Allergen Reactions  . Neomycin-Bacitracin Zn-Polymyx Other (See Comments)    Breaks out in "exczema like bumps"    History reviewed. No pertinent family history.  History   Social History  . Marital Status: Married    Spouse Name: N/A  . Number of Children: N/A  . Years of Education: N/A   Social History Main Topics  . Smoking status: Current Some Day Smoker -- 0.20 packs/day for 15 years    Types: Cigarettes  . Smokeless tobacco: Not on file  . Alcohol Use: Yes  . Drug Use: No  . Sexual Activity: Not on file   Other Topics Concern  . None   Social History Narrative    Review of Systems - See HPI.  All other ROS are negative.  BP 126/76 mmHg  Pulse 86  Temp(Src) 98.1 F (36.7 C) (Oral)  Resp 16  Wt 150 lb (68.04 kg)  SpO2 94%  Physical Exam  Constitutional: She is oriented to person, place, and time and well-developed, well-nourished, and in no distress.  HENT:  Head: Normocephalic and atraumatic.  Right Ear: External ear normal.  Left Ear: External ear normal.  Nose: Right sinus exhibits frontal sinus tenderness. Left sinus exhibits frontal sinus tenderness.  Mouth/Throat: Uvula is midline, oropharynx is clear and moist and mucous membranes are normal. No oropharyngeal exudate.  Eyes: Conjunctivae are normal. Pupils are equal, round, and reactive to light.  Neck: Neck supple.  Cardiovascular: Normal rate, regular rhythm, normal heart sounds and intact distal pulses.   Pulmonary/Chest: Effort normal. No respiratory distress. She has wheezes. She has no rales. She exhibits no tenderness.  Lymphadenopathy:    She has no cervical adenopathy.  Neurological: She is alert and oriented to person, place, and time.  Skin: Skin is warm and dry. No rash noted.  Psychiatric: Affect normal.  Vitals reviewed.   No results found for this or any previous visit (from the past 2160 hour(s)).  Assessment/Plan: Acute bacterial sinusitis W/ wheeze noted in lung bases.  Rx Augmentin.  Increase fluids.  Rest. Mucinex. Continue asthma medications. IM depomedrol given for wheezing.  Albuterol as directed.

## 2015-05-14 NOTE — Patient Instructions (Signed)
Please take antibiotic as directed.  Increase fluid intake.  Use Saline nasal spray.  Take a daily multivitamin. Use Mucinex twice daily.  Continue allergy medications, using Proair every 4-6 hours as needed over the next couple of days.  Place a humidifier in the bedroom.  Please call or return clinic if symptoms are not improving.  Sinusitis Sinusitis is redness, soreness, and swelling (inflammation) of the paranasal sinuses. Paranasal sinuses are air pockets within the bones of your face (beneath the eyes, the middle of the forehead, or above the eyes). In healthy paranasal sinuses, mucus is able to drain out, and air is able to circulate through them by way of your nose. However, when your paranasal sinuses are inflamed, mucus and air can become trapped. This can allow bacteria and other germs to grow and cause infection. Sinusitis can develop quickly and last only a short time (acute) or continue over a long period (chronic). Sinusitis that lasts for more than 12 weeks is considered chronic.  CAUSES  Causes of sinusitis include:  Allergies.  Structural abnormalities, such as displacement of the cartilage that separates your nostrils (deviated septum), which can decrease the air flow through your nose and sinuses and affect sinus drainage.  Functional abnormalities, such as when the small hairs (cilia) that line your sinuses and help remove mucus do not work properly or are not present. SYMPTOMS  Symptoms of acute and chronic sinusitis are the same. The primary symptoms are pain and pressure around the affected sinuses. Other symptoms include:  Upper toothache.  Earache.  Headache.  Bad breath.  Decreased sense of smell and taste.  A cough, which worsens when you are lying flat.  Fatigue.  Fever.  Thick drainage from your nose, which often is green and may contain pus (purulent).  Swelling and warmth over the affected sinuses. DIAGNOSIS  Your caregiver will perform a physical  exam. During the exam, your caregiver may:  Look in your nose for signs of abnormal growths in your nostrils (nasal polyps).  Tap over the affected sinus to check for signs of infection.  View the inside of your sinuses (endoscopy) with a special imaging device with a light attached (endoscope), which is inserted into your sinuses. If your caregiver suspects that you have chronic sinusitis, one or more of the following tests may be recommended:  Allergy tests.  Nasal culture A sample of mucus is taken from your nose and sent to a lab and screened for bacteria.  Nasal cytology A sample of mucus is taken from your nose and examined by your caregiver to determine if your sinusitis is related to an allergy. TREATMENT  Most cases of acute sinusitis are related to a viral infection and will resolve on their own within 10 days. Sometimes medicines are prescribed to help relieve symptoms (pain medicine, decongestants, nasal steroid sprays, or saline sprays).  However, for sinusitis related to a bacterial infection, your caregiver will prescribe antibiotic medicines. These are medicines that will help kill the bacteria causing the infection.  Rarely, sinusitis is caused by a fungal infection. In theses cases, your caregiver will prescribe antifungal medicine. For some cases of chronic sinusitis, surgery is needed. Generally, these are cases in which sinusitis recurs more than 3 times per year, despite other treatments. HOME CARE INSTRUCTIONS   Drink plenty of water. Water helps thin the mucus so your sinuses can drain more easily.  Use a humidifier.  Inhale steam 3 to 4 times a day (for example, sit in  the bathroom with the shower running).  Apply a warm, moist washcloth to your face 3 to 4 times a day, or as directed by your caregiver.  Use saline nasal sprays to help moisten and clean your sinuses.  Take over-the-counter or prescription medicines for pain, discomfort, or fever only as  directed by your caregiver. SEEK IMMEDIATE MEDICAL CARE IF:  You have increasing pain or severe headaches.  You have nausea, vomiting, or drowsiness.  You have swelling around your face.  You have vision problems.  You have a stiff neck.  You have difficulty breathing. MAKE SURE YOU:   Understand these instructions.  Will watch your condition.  Will get help right away if you are not doing well or get worse. Document Released: 12/11/2005 Document Revised: 03/04/2012 Document Reviewed: 12/26/2011 [facility] Patient Information 2014 [place], [place].

## 2015-05-14 NOTE — Assessment & Plan Note (Signed)
W/ wheeze noted in lung bases.  Rx Augmentin.  Increase fluids.  Rest. Mucinex. Continue asthma medications. IM depomedrol given for wheezing.  Albuterol as directed.

## 2015-05-19 ENCOUNTER — Ambulatory Visit (INDEPENDENT_AMBULATORY_CARE_PROVIDER_SITE_OTHER): Payer: Managed Care, Other (non HMO) | Admitting: Medical

## 2015-05-19 ENCOUNTER — Encounter: Payer: Self-pay | Admitting: Medical

## 2015-05-19 ENCOUNTER — Ambulatory Visit: Payer: Managed Care, Other (non HMO) | Admitting: Physician Assistant

## 2015-05-19 ENCOUNTER — Ambulatory Visit (HOSPITAL_BASED_OUTPATIENT_CLINIC_OR_DEPARTMENT_OTHER)
Admission: RE | Admit: 2015-05-19 | Discharge: 2015-05-19 | Disposition: A | Discharge status: Home / Self Care | Payer: Managed Care, Other (non HMO) | Source: Ambulatory Visit | Attending: Medical | Admitting: Medical

## 2015-05-19 ENCOUNTER — Telehealth: Payer: Self-pay | Admitting: Internal Medicine

## 2015-05-19 VITALS — BP 126/87 | HR 107 | Temp 97.8°F | Wt 147.8 lb

## 2015-05-19 DIAGNOSIS — J329 Chronic sinusitis, unspecified: Secondary | ICD-10-CM | POA: Insufficient documentation

## 2015-05-19 DIAGNOSIS — R062 Wheezing: Secondary | ICD-10-CM | POA: Diagnosis not present

## 2015-05-19 DIAGNOSIS — R0989 Other specified symptoms and signs involving the circulatory and respiratory systems: Secondary | ICD-10-CM | POA: Diagnosis not present

## 2015-05-19 DIAGNOSIS — J019 Acute sinusitis, unspecified: Secondary | ICD-10-CM | POA: Diagnosis not present

## 2015-05-19 DIAGNOSIS — R05 Cough: Secondary | ICD-10-CM | POA: Insufficient documentation

## 2015-05-19 DIAGNOSIS — B9689 Other specified bacterial agents as the cause of diseases classified elsewhere: Secondary | ICD-10-CM

## 2015-05-19 MED ORDER — METHYLPREDNISOLONE ACETATE 40 MG/ML IJ SUSP
40.0000 mg | Freq: Once | INTRAMUSCULAR | Status: AC
  Administered 2015-05-19: 40 mg via INTRAMUSCULAR

## 2015-05-19 MED ORDER — PREDNISONE 20 MG PO TABS: ORAL_TABLET | ORAL | Status: DC

## 2015-05-19 MED ORDER — CEFTRIAXONE SODIUM 1 G IJ SOLR
1.0000 g | Freq: Once | INTRAMUSCULAR | Status: AC
  Administered 2015-05-19: 1 g via INTRAMUSCULAR

## 2015-05-19 NOTE — Progress Notes (Signed)
Subjective:    Patient ID: Alexis Hodges, female    DOB: 1976/10/20, 39 y.o.   MRN: [id number]  HPI  Pt in with some sinus pressure still persists some seen last week on friday. Her  upper teeth hurt mildly. Pt given augmentin last week for sinus infection.   Pt states on Friday got steroid Im for asthma. She was using albuterol 2-3 times a day but just one puff over the weekend each time .But  on Monday started to feel sob and  more wheezing. No leg pain. Pt does smoke. She smokes at most half a pack.   Pt using guafenesin.  LMP- 2 wks ago.      Review of Systems  Constitutional: Negative for fever, chills, diaphoresis, activity change and fatigue.  HENT: Positive for sinus pressure.   Respiratory: Positive for cough, shortness of breath and wheezing. Negative for chest tightness.   Cardiovascular: Negative for chest pain, palpitations and leg swelling.  Gastrointestinal: Negative for nausea, vomiting and abdominal pain.  Musculoskeletal: Negative for neck pain and neck stiffness.  Neurological: Negative for dizziness, tremors, seizures, syncope, facial asymmetry, speech difficulty, weakness, light-headedness, numbness and headaches.  Hematological: Negative for adenopathy. Does not bruise/bleed easily.  Psychiatric/Behavioral: Negative for behavioral problems, confusion and agitation. The patient is not nervous/anxious.    Past Medical History  Diagnosis Date  . Asthma   . Allergic rhinitis, cause unspecified   . Anxiety   . Migraine headache     History   Social History  . Marital Status: Married    Spouse Name: N/A  . Number of Children: N/A  . Years of Education: N/A   Occupational History  . Not on file.   Social History Main Topics  . Smoking status: Current Some Day Smoker -- 0.20 packs/day for 15 years    Types: Cigarettes  . Smokeless tobacco: Not on file  . Alcohol Use: Yes  . Drug Use: No  . Sexual Activity: Not on file   Other Topics Concern    . Not on file   Social History Narrative    No past surgical history on file.  No family history on file.  Allergies  Allergen Reactions  . Neomycin-Bacitracin Zn-Polymyx Other (See Comments)    Breaks out in "exczema like bumps"    Current Outpatient Prescriptions on File Prior to Visit  Medication Sig Dispense Refill  . albuterol (PROAIR HFA) 108 (90 BASE) MCG/ACT inhaler Inhale 2 puffs into the lungs every 4 (four) hours as needed for wheezing or shortness of breath. 3 Inhaler 4  . amoxicillin-clavulanate (AUGMENTIN) 875-125 MG per tablet Take 1 tablet by mouth 2 (two) times daily. 20 tablet 0  . diphenhydrAMINE (BENADRYL) 25 MG tablet Take 25 mg by mouth every 6 (six) hours as needed.    . fluticasone (FLONASE) 50 MCG/ACT nasal spray 1-2 sprays each nostril at bedtime 48 g 3  . ipratropium (ATROVENT) 0.03 % nasal spray Place 2 sprays into both nostrils every 12 (twelve) hours. 90 mL 3  . montelukast (SINGULAIR) 10 MG tablet Take 1 tablet (10 mg total) by mouth at bedtime. 90 tablet 3  . Umeclidinium-Vilanterol (ANORO ELLIPTA) 62.5-25 MCG/INH AEPB Inhale 1 puff into the lungs daily. 60 each prn  . fexofenadine (ALLEGRA) 180 MG tablet Take 180 mg by mouth daily.     No current facility-administered medications on file prior to visit.    BP 126/87 mmHg  Pulse 133  Temp(Src) 97.8 F (36.6 C) (  Oral)  Wt 147 lb 12.8 oz (67.042 kg)  SpO2 97%  LMP 05/05/2015       Objective:   Physical Exam  General  Mental Status - Alert. General Appearance - Well groomed. Not in acute distress.  Skin Rashes- No Rashes.  HEENT Head- Normal. Ear Auditory Canal - Left- Normal. Right - Normal.Tympanic Membrane- Left- Normal. Right- Normal. Eye Sclera/Conjunctiva- Left- Normal. Right- Normal. Nose & Sinuses Nasal Mucosa- Left-  Boggy and Congested. Right-  Boggy and  Congested.Bilateral  Maxillary sinus pressure but no frontal sinus pressure. Mouth & Throat Lips: Upper Lip- Normal:  no dryness, cracking, pallor, cyanosis, or vesicular eruption. Lower Lip-Normal: no dryness, cracking, pallor, cyanosis or vesicular eruption. Buccal Mucosa- Bilateral- No Aphthous ulcers. Oropharynx- No Discharge or Erythema. Tonsils: Characteristics- Bilateral- No Erythema or Congestion. Size/Enlargement- Bilateral- No enlargement. Discharge- bilateral-None.  Neck Neck- Supple. No Masses.   Chest and Lung Exam Auscultation: Breath Sounds:-mild scattered wheezing and shallow breathing but no use of accessory muscle.  Cardiovascular Auscultation:Rythm- Regular, rate and rhythm. Murmurs & Other Heart Sounds:Ausculatation of the heart reveal- No Murmurs.  Lymphatic Head & Neck General Head & Neck Lymphatics: Bilateral: Description- No Localized lymphadenopathy.       Assessment & Plan:

## 2015-05-19 NOTE — Addendum Note (Signed)
Addended by: Noreene LarssonLARSON, Ki[place] Hollar A on: 05/19/2015 02:45 PM   Modules accepted: Orders

## 2015-05-19 NOTE — Telephone Encounter (Signed)
Patient Name: Alexis MingsLYDIA Brunetti  DOB: 24-Jan-1976    Initial Comment Caller states she is having asthma and shortness of breath.   Nurse Assessment  Nurse: Renaldo FiddlerAdkins, RN, Raynelle FanningJulie Date/Time Lamount Cohen(Eastern Time): 05/19/2015 9:07:07 AM  Confirm and document reason for call. If symptomatic, describe symptoms. ---Caller states she has asthma and is having shortness of breath. She was seen on Friday, dx with a sinus infection and is on abx. She was coughing at that appt, was given a steroid shot but she is worse this morning. She did use her Proair Inhaler1 hr ago, but only took 1 puff.  Has the patient traveled out of the country within the last 30 days? ---Not Applicable  Does the patient require triage? ---Yes  Related visit to physician within the last 2 weeks? ---Yes   Friday  Does the PT have any chronic conditions? (i.e. diabetes, asthma, etc.) ---Yes  List chronic conditions. ---Asthma  Did the patient indicate they were pregnant? ---No     Guidelines    Guideline Title Affirmed Question Affirmed Notes  Asthma Attack [1] MILD asthma attack (e.g., no SOB at rest, mild SOB with walking, speaks normally in sentences, mild wheezing) AND [2] persists > 24 hours on appropriate treatment    Final Disposition User   See Physician within 24 Hours Renaldo FiddlerAdkins, RN, Raynelle FanningJulie    Comments  Caller had disconnected from urgent queue, as noted she had taken 1 puff of Proair. States she has made an appt for today at 4 om. Advised to use 4 puffs of her Proair now and I will cb in 20 mins. Verbalized understanding.  Spoke to caller, states she is feeling much better, she still has wheezing, but her sob has improved. She will continue to use her Proair q 4 hrs and cb if her sx worsen.

## 2015-05-19 NOTE — Assessment & Plan Note (Addendum)
Rx depomedrol IM 40 mg. Prednisone 20 mg  3 times a day for 5 days. Continue current inhalers.   Please stop smoking.  Will get cxr today. Make sure no infectious component in lungs.

## 2015-05-19 NOTE — Progress Notes (Signed)
Pre visit review using our clinic review tool, if applicable. No additional management support is needed unless otherwise documented below in the visit note. 

## 2015-05-19 NOTE — Assessment & Plan Note (Addendum)
Persisting despite  augmentin.. Will give rocephin. Advised continue augmentin since only mid way through treatment.

## 2015-05-19 NOTE — Patient Instructions (Addendum)
Acute bacterial sinusitis Persisting despite  augmentin.. Will give rocephin. Advised continue augmentin since only mid way through treatment.   Wheezing Rx depomedrol IM 40 mg. Prednisone 20 mg  3 times a day for 5 days. Continue current inhalers.   Please stop smoking.  Will get cxr today. Make sure no infectious component in lungs.     Follow up in 7 days or as needed  Worsening signs or symptoms over weekend then ED eval.

## 2015-05-19 NOTE — Telephone Encounter (Signed)
Agree with plan 

## 2015-06-16 ENCOUNTER — Telehealth: Payer: Self-pay | Admitting: Internal Medicine

## 2015-06-16 MED ORDER — AMOXICILLIN-POT CLAVULANATE 875-125 MG PO TABS: 1.0000 | ORAL_TABLET | Freq: Two times a day (BID) | ORAL | Status: DC

## 2015-06-16 NOTE — Telephone Encounter (Signed)
Spoke with pt, she is aware of recs.  rx sent to preferred pharmacy.  Nothing further needed.

## 2015-06-16 NOTE — Telephone Encounter (Signed)
Offer augmentin 875, # 14, 1 twice daily 

## 2015-06-16 NOTE — Telephone Encounter (Signed)
Spoke with pt. States that bronchitis is back. Reports increased dry coughing, chest tightness, SOB and wheezing. Denies fever. Has been taking Mucinex with no relief. Would like something called in.  Allergies  Allergen Reactions  . Neomycin-Bacitracin Zn-Polymyx Other (See Comments)    Breaks out in "exczema like bumps"   Current Outpatient Prescriptions on File Prior to Visit  Medication Sig Dispense Refill  . albuterol (PROAIR HFA) 108 (90 BASE) MCG/ACT inhaler Inhale 2 puffs into the lungs every 4 (four) hours as needed for wheezing or shortness of breath. 3 Inhaler 4  . amoxicillin-clavulanate (AUGMENTIN) 875-125 MG per tablet Take 1 tablet by mouth 2 (two) times daily. 20 tablet 0  . diphenhydrAMINE (BENADRYL) 25 MG tablet Take 25 mg by mouth every 6 (six) hours as needed.    . fexofenadine (ALLEGRA) 180 MG tablet Take 180 mg by mouth daily.    . fluticasone (FLONASE) 50 MCG/ACT nasal spray 1-2 sprays each nostril at bedtime 48 g 3  . ipratropium (ATROVENT) 0.03 % nasal spray Place 2 sprays into both nostrils every 12 (twelve) hours. 90 mL 3  . montelukast (SINGULAIR) 10 MG tablet Take 1 tablet (10 mg total) by mouth at bedtime. 90 tablet 3  . predniSONE (DELTASONE) 20 MG tablet 1 tab po tid x 5 days 15 tablet 0  . Umeclidinium-Vilanterol (ANORO ELLIPTA) 62.5-25 MCG/INH AEPB Inhale 1 puff into the lungs daily. 60 each prn   No current facility-administered medications on file prior to visit.    CY - please advise. Thanks.

## 2015-06-18 ENCOUNTER — Encounter (HOSPITAL_COMMUNITY): Payer: Self-pay | Admitting: Emergency Medicine

## 2015-06-18 ENCOUNTER — Inpatient Hospital Stay (HOSPITAL_COMMUNITY)
Admission: EM | Admit: 2015-06-18 | Discharge: 2015-06-20 | DRG: 202 | Disposition: A | Discharge status: Home / Self Care | Payer: Managed Care, Other (non HMO) | Attending: Internal Medicine | Admitting: Internal Medicine

## 2015-06-18 ENCOUNTER — Emergency Department (HOSPITAL_COMMUNITY): Payer: Managed Care, Other (non HMO)

## 2015-06-18 DIAGNOSIS — J45901 Unspecified asthma with (acute) exacerbation: Principal | ICD-10-CM | POA: Diagnosis present

## 2015-06-18 DIAGNOSIS — Z79899 Other long term (current) drug therapy: Secondary | ICD-10-CM | POA: Diagnosis not present

## 2015-06-18 DIAGNOSIS — F17210 Nicotine dependence, cigarettes, uncomplicated: Secondary | ICD-10-CM | POA: Diagnosis present

## 2015-06-18 DIAGNOSIS — J9601 Acute respiratory failure with hypoxia: Secondary | ICD-10-CM | POA: Diagnosis present

## 2015-06-18 DIAGNOSIS — Z8249 Family history of ischemic heart disease and other diseases of the circulatory system: Secondary | ICD-10-CM | POA: Diagnosis not present

## 2015-06-18 DIAGNOSIS — R0602 Shortness of breath: Secondary | ICD-10-CM | POA: Diagnosis present

## 2015-06-18 DIAGNOSIS — Z7951 Long term (current) use of inhaled steroids: Secondary | ICD-10-CM

## 2015-06-18 DIAGNOSIS — R0902 Hypoxemia: Secondary | ICD-10-CM

## 2015-06-18 LAB — BASIC METABOLIC PANEL
ANION GAP: 10 (ref 5–15)
BUN: 9 mg/dL (ref 6–20)
CHLORIDE: 109 mmol/L (ref 101–111)
CO2: 20 mmol/L — ABNORMAL LOW (ref 22–32)
Calcium: 8.7 mg/dL — ABNORMAL LOW (ref 8.9–10.3)
Creatinine, Ser: 0.78 mg/dL (ref 0.44–1.00)
GFR calc Af Amer: >60 mL/min (ref >60)
GFR calc non Af Amer: >60 mL/min (ref >60)
Glucose, Bld: 112 mg/dL — ABNORMAL HIGH (ref 65–99)
Potassium: 3.8 mmol/L (ref 3.5–5.1)
Sodium: 139 mmol/L (ref 135–145)

## 2015-06-18 LAB — CBC WITH DIFFERENTIAL/PLATELET
BASOS PCT: 1 % (ref 0–1)
Basophils Absolute: 0 10*3/uL (ref 0.0–0.1)
EOS ABS: 0.6 10*3/uL (ref 0.0–0.7)
Eosinophils Relative: 7 % — ABNORMAL HIGH (ref 0–5)
HCT: 43.6 % (ref 36.0–46.0)
Hemoglobin: 15.1 g/dL — ABNORMAL HIGH (ref 12.0–15.0)
Lymphocytes Relative: 15 % (ref 12–46)
Lymphs Abs: 1.3 10*3/uL (ref 0.7–4.0)
MCH: 34.7 pg — ABNORMAL HIGH (ref 26.0–34.0)
MCHC: 34.6 g/dL (ref 30.0–36.0)
MCV: 100.2 fL — ABNORMAL HIGH (ref 78.0–100.0)
MONOS PCT: 7 % (ref 3–12)
Monocytes Absolute: 0.6 10*3/uL (ref 0.1–1.0)
NEUTROS PCT: 70 % (ref 43–77)
Neutro Abs: 5.8 10*3/uL (ref 1.7–7.7)
PLATELETS: 258 10*3/uL (ref 150–400)
RBC: 4.35 MIL/uL (ref 3.87–5.11)
RDW: 12.4 % (ref 11.5–15.5)
WBC: 8.3 10*3/uL (ref 4.0–10.5)

## 2015-06-18 MED ORDER — SODIUM CHLORIDE 0.9 % IV SOLN
INTRAVENOUS | Status: DC
  Administered 2015-06-18: 125 mL/h via INTRAVENOUS

## 2015-06-18 MED ORDER — IPRATROPIUM-ALBUTEROL 0.5-2.5 (3) MG/3ML IN SOLN
3.0000 mL | RESPIRATORY_TRACT | Status: DC
  Administered 2015-06-18 – 2015-06-20 (×12): 3 mL via RESPIRATORY_TRACT
  Filled 2015-06-18 (×13): qty 3

## 2015-06-18 MED ORDER — METHYLPREDNISOLONE SODIUM SUCC 125 MG IJ SOLR
60.0000 mg | Freq: Two times a day (BID) | INTRAMUSCULAR | Status: DC
  Administered 2015-06-18 – 2015-06-20 (×4): 60 mg via INTRAVENOUS
  Filled 2015-06-18 (×8): qty 0.96

## 2015-06-18 MED ORDER — ACETAMINOPHEN 650 MG RE SUPP: 650.0000 mg | Freq: Four times a day (QID) | RECTAL | Status: DC | PRN

## 2015-06-18 MED ORDER — IPRATROPIUM-ALBUTEROL 0.5-2.5 (3) MG/3ML IN SOLN
3.0000 mL | Freq: Once | RESPIRATORY_TRACT | Status: AC
  Administered 2015-06-18: 3 mL via RESPIRATORY_TRACT
  Filled 2015-06-18: qty 3

## 2015-06-18 MED ORDER — IPRATROPIUM-ALBUTEROL 0.5-2.5 (3) MG/3ML IN SOLN: 3.0000 mL | RESPIRATORY_TRACT | Status: DC | PRN

## 2015-06-18 MED ORDER — IPRATROPIUM BROMIDE 0.02 % IN SOLN
0.5000 mg | Freq: Once | RESPIRATORY_TRACT | Status: AC
  Administered 2015-06-18: 0.5 mg via RESPIRATORY_TRACT
  Filled 2015-06-18: qty 2.5

## 2015-06-18 MED ORDER — ONDANSETRON HCL 4 MG PO TABS
4.0000 mg | ORAL_TABLET | Freq: Four times a day (QID) | ORAL | Status: DC | PRN
Start: 2015-06-18 — End: 2015-06-20

## 2015-06-18 MED ORDER — ACETAMINOPHEN 325 MG PO TABS: 650.0000 mg | ORAL_TABLET | Freq: Four times a day (QID) | ORAL | Status: DC | PRN

## 2015-06-18 MED ORDER — ONDANSETRON HCL 4 MG/2ML IJ SOLN: 4.0000 mg | Freq: Four times a day (QID) | INTRAMUSCULAR | Status: DC | PRN

## 2015-06-18 NOTE — H&P (Signed)
[facility] History and Physical  JIM BECKSTROM YQM:[id number] DOB: May 08, 1976 DOA: 06/18/2015  Referring physician: ER physician: Dr. Baxter Hire Ward  PCP: Oliver Barre, MD  Chief Complaint: shortness of breath, wheezing   HPI:  39 year old female with past medical history of asthma, trichotillomania who presented to [facility] ED with worsening shortness of breath for past couple of hours prior to this admission. She apparently was doing well until she woke up in the morning and could not catch her breath. She says she was wheezing and her albuterol inhaler at home did not provide significant symptomatic relief. No fever, no cough. No chest pain, palpations. No abdominal pain, nausea or vomiting. No reports of diarrhea or blood in stool.   Pt was hemodynamically stable in ED. She was given nebulizer treatment in ED along with solumedrol 125 mg IV but continued to have hypoxia especially with ambulation when it would drop down to 85% on room air. She was admitted for asthma exacerbation management.   Assessment & Plan    Principal Problem:   Acute respiratory failure with hypoxia /  Asthma exacerbation - Start duoneb every 4 hours scheduled and every 2 hours as needed for shortness of breath or wheezing - Start solumedrol 60 mg Q 12 hours -Continue oxygen support via nasal canula to keep O2 sat above 90% - Monitor on medical floor    DVT prophylaxis:  - SCD's bilaterally   Radiological Exams on Admission: Dg Chest Portable 1 View 06/18/2015  Findings which are felt to be indicative of a degree of chronic bronchitis. No edema or consolidation.   Electronically Signed   By: Bretta Bang III M.D.   On: 06/18/2015 09:52    EKG: I have personally reviewed EKG. EKG shows sinus tachycardia   Code Status: Full Family Communication: Plan of care discussed with the patient  Disposition Plan: Admit for further evaluation  Manson Passey, MD  [facility] Pager [phone]  Time spent  in minutes: 55 minutes  Review of Systems:  Constitutional: Negative for fever, chills and malaise/fatigue. Negative for diaphoresis.  HENT: Negative for hearing loss, ear pain, nosebleeds, congestion, sore throat, neck pain, tinnitus and ear discharge.   Eyes: Negative for blurred vision, double vision, photophobia, pain, discharge and redness.  Respiratory: per HPI.   Cardiovascular: Negative for chest pain, palpitations, orthopnea, claudication and leg swelling.  Gastrointestinal: Negative for nausea, vomiting and abdominal pain. Negative for heartburn, constipation, blood in stool and melena.  Genitourinary: Negative for dysuria, urgency, frequency, hematuria and flank pain.  Musculoskeletal: Negative for myalgias, back pain, joint pain and falls.  Skin: Negative for itching and rash.  Neurological: Negative for dizziness and weakness. Negative for tingling, tremors, sensory change, speech change, focal weakness, loss of consciousness and headaches.  Endo/Heme/Allergies: Negative for environmental allergies and polydipsia. Does not bruise/bleed easily.  Psychiatric/Behavioral: Negative for suicidal ideas. The patient is not nervous/anxious.      Past Medical History  Diagnosis Date  . Asthma   . Allergic rhinitis, cause unspecified   . Anxiety   . Migraine headache    History reviewed. No pertinent past surgical history. Social History:  reports that she has been smoking Cigarettes.  She has a 3 pack-year smoking history. She does not have any smokeless tobacco history on file. She reports that she drinks alcohol. She reports that she does not use illicit drugs.  Allergies  Allergen Reactions  . Neomycin-Bacitracin Zn-Polymyx Other (See Comments)    Breaks out in "  exczema like bumps"    Family History: hypertension in family    Prior to Admission medications   Medication Sig Start Date End Date Taking? Authorizing Provider  albuterol (PROAIR HFA) 108 (90 BASE) MCG/ACT  inhaler Inhale 2 puffs into the lungs every 4 (four) hours as needed for wheezing or shortness of breath. 10/16/14  Yes Waymon Budge, MD  diphenhydrAMINE (BENADRYL) 25 MG tablet Take 25 mg by mouth every 6 (six) hours as needed.   Yes Historical Provider, MD  fexofenadine (ALLEGRA) 180 MG tablet Take 180 mg by mouth daily. 09/04/12 06/18/15 Yes Newt Lukes, MD  fluticasone (FLONASE) 50 MCG/ACT nasal spray 1-2 sprays each nostril at bedtime 10/05/14  Yes Waymon Budge, MD  guaiFENesin (MUCINEX) 600 MG 12 hr tablet Take 1,200 mg by mouth 2 (two) times daily as needed for cough or to loosen phlegm.   Yes Historical Provider, MD  ipratropium (ATROVENT) 0.03 % nasal spray Place 2 sprays into both nostrils every 12 (twelve) hours. 10/05/14  Yes Waymon Budge, MD  montelukast (SINGULAIR) 10 MG tablet Take 1 tablet (10 mg total) by mouth at bedtime. 10/05/14  Yes Waymon Budge, MD  Umeclidinium-Vilanterol (ANORO ELLIPTA) 62.5-25 MCG/INH AEPB Inhale 1 puff into the lungs daily. 05/03/15  Yes Waymon Budge, MD   Physical Exam: Filed Vitals:   06/18/15 4098 06/18/15 0949 06/18/15 0953  BP:  167/93   Pulse:  110   Temp:   98.3 F (36.8 C)  TempSrc:   Oral  Resp:  18 25  SpO2: 100% 97% 97%    Physical Exam  Constitutional: Appears well-developed and well-nourished. No distress.  HENT: Normocephalic. No tonsillar erythema or exudates Eyes: Conjunctivae are normal. No scleral icterus.  Neck: Normal ROM. Neck supple. No JVD. No tracheal deviation. No thyromegaly.  CVS: RRR, S1/S2 +, no murmurs, no gallops, no carotid bruit.  Pulmonary: wheezing bilaterally, no rhonchi or crackles.  Abdominal: Soft. BS +,  no distension, tenderness, rebound or guarding.  Musculoskeletal: Normal range of motion. No edema and no tenderness.  Lymphadenopathy: No lymphadenopathy noted, cervical, inguinal. Neuro: Alert. Normal reflexes, muscle tone coordination. No focal neurologic deficits. Skin: Skin is  warm and dry. No rash noted.  No erythema. No pallor.  Psychiatric: Normal mood and affect. Behavior, judgment, thought content normal.   Labs on Admission:  Basic Metabolic Panel:  Recent Labs Lab 06/18/15 1004  NA 139  K 3.8  CL 109  CO2 20*  GLUCOSE 112*  BUN 9  CREATININE 0.78  CALCIUM 8.7*   Liver Function Tests: No results for input(s): AST, ALT, ALKPHOS, BILITOT, PROT, ALBUMIN in the last 168 hours. No results for input(s): LIPASE, AMYLASE in the last 168 hours. No results for input(s): AMMONIA in the last 168 hours. CBC:  Recent Labs Lab 06/18/15 1004  WBC 8.3  NEUTROABS 5.8  HGB 15.1*  HCT 43.6  MCV 100.2*  PLT 258   Cardiac Enzymes: No results for input(s): CKTOTAL, CKMB, CKMBINDEX, TROPONINI in the last 168 hours. BNP: Invalid input(s): POCBNP CBG: No results for input(s): GLUCAP in the last 168 hours.  If 7PM-7AM, please contact night-coverage [email or web address] Password [facility] 06/18/2015, 11:51 AM

## 2015-06-18 NOTE — ED Notes (Signed)
Per EMS pt comes from home for resp distress that started this morning.  Pt had woken up her husband and reported to EMS pt was unable to speak due to resp distress. EMS reports pt had no lower lung sounds with CAP of 10 and O2 saturation wasn't reading, RR 60 and pt fingers were very pale white.  Pt was on CPAP with aerosol neb going upon arrival.  Pt had Normal Saline bolus, Atrovent 1mg , Mag 2g IV, Albuterol 20mg  and Solu Medrol 125mg  in route.   CPAP pressure was 102ml of water continuous.

## 2015-06-18 NOTE — ED Notes (Signed)
Per EMS, husband woke her up and she could not talk-SOB for 3 days-history of asthma and bronchitis-poor air exchange, 20 mg of Albuterol given, placed on Bi pap.

## 2015-06-18 NOTE — ED Notes (Signed)
Pt was walked around ED floor upon request of Dr. Elesa Massed pt O2 stats fell and maintained at 85% with a small complaint of breathing difficulty

## 2015-06-18 NOTE — ED Notes (Signed)
Called floor to give report. RN unavailable at this time.

## 2015-06-18 NOTE — ED Notes (Signed)
Called floor to give report. RN unavailable at this time, awaiting a call back.

## 2015-06-18 NOTE — ED Notes (Signed)
Bed: RESB Expected date:  Expected time:  Means of arrival:  Comments: EMS/resp distress 

## 2015-06-18 NOTE — ED Provider Notes (Addendum)
TIME SEEN: 9:25 AM  CHIEF COMPLAINT: Respiratory distress  HPI: Pt is a 39 y.o. female with history of asthma, migraines who presents to the emergency department with asthma exacerbation, respiratory distress. Reports she has been diagnosed with bronchitis intermittently throughout the past month and was recently started on Augmentin yesterday. States she did have prednisone earlier this month but is not on steroid currently. Today woke up feeling very short of breath, wheezing. Used her rescue inhaler with no relief. EMS found the patient to be tachypneic with capnography 10. Unable to obtain an oxygen saturation. She was in respiratory distress. She received a total of 20 mg of albuterol, 1 mg of Atrovent, 500 mL of normal saline, 125 mg of IV Solu-Medrol and 2 g of IV magnesium. Was started on C-pap. Patient reports feeling much better. She states she has been coughing up clear sputum. No fever. No pain. No history of PE or DVT. No lower extremity swelling or pain. Has never been admitted to the hospital or intubated for her asthma. Does not wear oxygen at home.  ROS: See HPI Constitutional: no fever  Eyes: no drainage  ENT: no runny nose   Cardiovascular:  no chest pain  Resp:  SOB  GI: no vomiting GU: no dysuria Integumentary: no rash  Allergy: no hives  Musculoskeletal: no leg swelling  Neurological: no slurred speech ROS otherwise negative  PAST MEDICAL HISTORY/PAST SURGICAL HISTORY:  Past Medical History  Diagnosis Date  . Asthma   . Allergic rhinitis, cause unspecified   . Anxiety   . Migraine headache     MEDICATIONS:  Prior to Admission medications   Medication Sig Start Date End Date Taking? Authorizing Provider  albuterol (PROAIR HFA) 108 (90 BASE) MCG/ACT inhaler Inhale 2 puffs into the lungs every 4 (four) hours as needed for wheezing or shortness of breath. 10/16/14   Waymon Budge, MD  amoxicillin-clavulanate (AUGMENTIN) 875-125 MG per tablet Take 1 tablet by mouth  2 (two) times daily. 05/14/15   Waldon Merl, PA-C  amoxicillin-clavulanate (AUGMENTIN) 875-125 MG per tablet Take 1 tablet by mouth 2 (two) times daily. 06/16/15   Waymon Budge, MD  diphenhydrAMINE (BENADRYL) 25 MG tablet Take 25 mg by mouth every 6 (six) hours as needed.    Historical Provider, MD  fexofenadine (ALLEGRA) 180 MG tablet Take 180 mg by mouth daily. 09/04/12 04/06/15  Newt Lukes, MD  fluticasone (FLONASE) 50 MCG/ACT nasal spray 1-2 sprays each nostril at bedtime 10/05/14   Waymon Budge, MD  ipratropium (ATROVENT) 0.03 % nasal spray Place 2 sprays into both nostrils every 12 (twelve) hours. 10/05/14   Waymon Budge, MD  montelukast (SINGULAIR) 10 MG tablet Take 1 tablet (10 mg total) by mouth at bedtime. 10/05/14   Waymon Budge, MD  predniSONE (DELTASONE) 20 MG tablet 1 tab po tid x 5 days 05/19/15   Esperanza Richters, PA-C  Umeclidinium-Vilanterol [facility] ELLIPTA) 62.5-25 MCG/INH AEPB Inhale 1 puff into the lungs daily. 05/03/15   Waymon Budge, MD    ALLERGIES:  Allergies  Allergen Reactions  . Neomycin-Bacitracin Zn-Polymyx Other (See Comments)    Breaks out in "exczema like bumps"    SOCIAL HISTORY:  History  Substance Use Topics  . Smoking status: Current Some Day Smoker -- 0.20 packs/day for 15 years    Types: Cigarettes  . Smokeless tobacco: Not on file  . Alcohol Use: Yes    FAMILY HISTORY: No family history on file.  EXAM:  BP 167/93 mmHg  Pulse 110  Temp(Src) 98.3 F (36.8 C) (Oral)  Resp 25  SpO2 97%  LMP 05/05/2015 CONSTITUTIONAL: Alert and oriented and responds appropriately to questions. Well-appearing; well-nourished HEAD: Normocephalic EYES: Conjunctivae clear, PERRL ENT: normal nose; no rhinorrhea; moist mucous membranes; pharynx without lesions noted NECK: Supple, no meningismus, no LAD  CARD: Regular and tachycardic; S1 and S2 appreciated; no murmurs, no clicks, no rubs, no gallops RESP: Normal chest excursion without  splinting, patient is tachypneic and speaking short sentences but no hypoxia, diffuse expiratory wheezing, slightly diminished at her bases bilaterally, no rhonchi or rales ABD/GI: Normal bowel sounds; non-distended; soft, non-tender, no rebound, no guarding, no peritoneal signs BACK:  The back appears normal and is non-tender to palpation, there is no CVA tenderness EXT: Normal ROM in all joints; non-tender to palpation; no edema; normal capillary refill; no cyanosis, no calf tenderness or swelling    SKIN: Normal color for age and race; warm NEURO: Moves all extremities equally, sensation to light touch intact diffusely, cranial nerves II through XII intact PSYCH: The patient's mood and manner are appropriate. Grooming and personal hygiene are appropriate.  MEDICAL DECISION MAKING: Patient here with asthma exacerbation with significant improvement. Received Solu-Medrol, albuterol, Atrovent, magnesium prior to arriving in the ED. We'll give another DuoNeb treatment, obtain labs, ABG, EKG, chest x-ray.  ED PROGRESS: 10:20 AM  Patient's ABG shows a PCO2 of 33. PO2 66, pH 7.4. Labs otherwise unremarkable. Chest x-ray shows chronic bronchitis with no edema or consolidation. Patient continues to improve is still having expiratory wheezing. We'll give second DuoNeb treatment, ambulate with pulse ox.   11:20 AM  Pt still having expiratory wheezing. When she has ambulated a short distance she becomes very short of breath, tachypneic and her oxygen saturation drops to 85% on room air. She does not wear oxygen at home. I feel she will need admission for her asthma exacerbation. Her PCP is with [facility].     11:45 AM  D/w Dr. Elisabeth Pigeon for admission to medical bed.   EKG Interpretation  Date/Time:  Friday June 18 2015 09:43:02 EDT Ventricular Rate:  103 PR Interval:  117 QRS Duration: 90 QT Interval:  339 QTC Calculation: 444 R Axis:   66 Text Interpretation:  Sinus tachycardia RSR' in V1 or V2,  probably normal variant No old tracing to compare Confirmed by Ryin Ambrosius,  DO, Naithen Rivenburg ([id number]) on 06/18/2015 9:46:20 AM        CRITICAL CARE Performed by: Raelyn Number   Total critical care time: 35 minutes  Critical care time was exclusive of separately billable procedures and treating other patients.  Critical care was necessary to treat or prevent imminent or life-threatening deterioration.  Critical care was time spent personally by me on the following activities: development of treatment plan with patient and/or surrogate as well as nursing, discussions with consultants, evaluation of patient's response to treatment, examination of patient, obtaining history from patient or surrogate, ordering and performing treatments and interventions, ordering and review of laboratory studies, ordering and review of radiographic studies, pulse oximetry and re-evaluation of patient's condition.    Layla Maw Santi Troung, DO 06/18/15 1148  Nayla Dias N Jovanni Rash, DO 06/18/15 1148

## 2015-06-19 LAB — CBC
HCT: 42.1 % (ref 36.0–46.0)
Hemoglobin: 14.4 g/dL (ref 12.0–15.0)
MCH: 34.5 pg — ABNORMAL HIGH (ref 26.0–34.0)
MCHC: 34.2 g/dL (ref 30.0–36.0)
MCV: 101 fL — ABNORMAL HIGH (ref 78.0–100.0)
PLATELETS: 284 10*3/uL (ref 150–400)
RBC: 4.17 MIL/uL (ref 3.87–5.11)
RDW: 12.7 % (ref 11.5–15.5)
WBC: 14.1 10*3/uL — ABNORMAL HIGH (ref 4.0–10.5)

## 2015-06-19 LAB — COMPREHENSIVE METABOLIC PANEL
ALBUMIN: 4.4 g/dL (ref 3.5–5.0)
ALT: 15 U/L (ref 14–54)
AST: 16 U/L (ref 15–41)
Alkaline Phosphatase: 62 U/L (ref 38–126)
Anion gap: 11 (ref 5–15)
BUN: 8 mg/dL (ref 6–20)
CALCIUM: 9.4 mg/dL (ref 8.9–10.3)
CHLORIDE: 107 mmol/L (ref 101–111)
CO2: 21 mmol/L — AB (ref 22–32)
Creatinine, Ser: 0.8 mg/dL (ref 0.44–1.00)
GFR calc Af Amer: >60 mL/min (ref >60)
GFR calc non Af Amer: >60 mL/min (ref >60)
GLUCOSE: 134 mg/dL — AB (ref 65–99)
Potassium: 3.9 mmol/L (ref 3.5–5.1)
Sodium: 139 mmol/L (ref 135–145)
Total Bilirubin: 0.6 mg/dL (ref 0.3–1.2)
Total Protein: 7.5 g/dL (ref 6.5–8.1)

## 2015-06-19 LAB — GLUCOSE, CAPILLARY: Glucose-Capillary: 140 mg/dL — ABNORMAL HIGH (ref 65–99)

## 2015-06-19 MED ORDER — DM-GUAIFENESIN ER 30-600 MG PO TB12
1.0000 | ORAL_TABLET | Freq: Two times a day (BID) | ORAL | Status: DC
  Administered 2015-06-19 – 2015-06-20 (×3): 1 via ORAL
  Filled 2015-06-19 (×4): qty 1

## 2015-06-19 MED ORDER — TRAMADOL HCL 50 MG PO TABS: 50.0000 mg | ORAL_TABLET | Freq: Four times a day (QID) | ORAL | Status: DC | PRN

## 2015-06-19 NOTE — Progress Notes (Signed)
Patient ID: Alexis Hodges, female   DOB: September 28, 1976, 39 y.o.   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 She was given nebulizer treatment in ED along with solumedrol 125 mg IV but continued to have hypoxia especially with ambulation. She was admitted for asthma exacerbation management.   Assessment/Plan:    Principal Problem: Acute respiratory failure with hypoxia / Asthma exacerbation - Still wheezing so we will continue duoneb every 4 hours scheduled and every 2 hours as needed for shortness of breath or wheezing - Continue solumedrol 60 mg Q 12 hours - Continue oxygen support via nasal canula to keep O2 sat above 90%   DVT prophylaxis:  - SCD's bilaterally     Code Status: Full.  Family Communication:  plan of care discussed with the patient Disposition Plan: Home by 06/20/2015.   IV access:  Peripheral IV  Procedures and diagnostic studies:    Dg Chest Portable 1 View 06/18/2015  Findings which are felt to be indicative of a degree of chronic bronchitis. No edema or consolidation.     Medical Consultants:  None   Other Consultants:  None   IAnti-Infectives:   None    Jacque Byron, MD  [facility] Pager [phone]  Time spent in minutes: 15 minutes  If 7PM-7AM, please contact night-coverage [email or web address] Password [facility] 06/19/2015, 12:52 PM   LOS: 1 day    HPI/Subjective: No acute overnight events. 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   Cardiovascular: Regular rate and rhythm, S1/S2, no murmurs  Respiratory: (+) wheezing in upper lung lobes, no crackles, no rhonchi  Abdomen: Soft, non tender, non distended, bowel sounds present  Extremities: No edema, pulses DP and PT palpable bilaterally  Neuro: Grossly nonfocal  Data Reviewed: Basic Metabolic Panel:  Recent Labs Lab 06/18/15 1004 06/19/15 0519  NA 139 139  K 3.8 3.9  CL 109 107  CO2 20* 21*  GLUCOSE 112* 134*  BUN 9 8  CREATININE 0.78 0.80  CALCIUM 8.7* 9.4   Liver Function Tests:  Recent Labs Lab 06/19/15 0519  AST 16  ALT 15  ALKPHOS 62  BILITOT 0.6  PROT 7.5  ALBUMIN 4.4   No results for input(s): LIPASE, AMYLASE in the last 168 hours. No results for input(s): AMMONIA in the last 168 hours. CBC:  Recent Labs Lab 06/18/15 1004 06/19/15 0519  WBC 8.3 14.1*  NEUTROABS 5.8  --  HGB 15.1* 14.4  HCT 43.6 42.1  MCV 100.2* 101.0*  PLT 258 284   Cardiac Enzymes: No results for input(s): CKTOTAL, CKMB, CKMBINDEX, TROPONINI in the last 168 hours. BNP: Invalid input(s): POCBNP CBG:  Recent Labs Lab 06/19/15 0724  GLUCAP 140*    No results found for this or any previous visit (from the past 240 hour(s)).   Scheduled Meds: . dextromethorphan-guaiFENesin  1 tablet Oral BID  . ipratropium-albuterol  3 mL Nebulization Q4H  . methylPREDNISolone (SOLU-MEDROL) injection  60 mg Intravenous Q12H   Continuous Infusions:

## 2015-06-20 LAB — GLUCOSE, CAPILLARY: GLUCOSE-CAPILLARY: 121 mg/dL — AB (ref 65–99)

## 2015-06-20 MED ORDER — ALBUTEROL SULFATE (2.5 MG/3ML) 0.083% IN NEBU: 2.5000 mg | INHALATION_SOLUTION | Freq: Four times a day (QID) | RESPIRATORY_TRACT | Status: AC | PRN

## 2015-06-20 MED ORDER — PREDNISONE 5 MG PO TABS: 50.0000 mg | ORAL_TABLET | Freq: Every day | ORAL | Status: AC

## 2015-06-20 MED ORDER — MOMETASONE FURO-FORMOTEROL FUM 100-5 MCG/ACT IN AERO: 2.0000 | INHALATION_SPRAY | Freq: Two times a day (BID) | RESPIRATORY_TRACT | Status: AC

## 2015-06-20 NOTE — Progress Notes (Signed)
Pt discharged.  Accompanied by husband.  Leaving with prescriptions and nebulizer machine. Leaving with RTW note from physician.  No complaints.  Room air.  No s/s of distress.

## 2015-06-20 NOTE — Discharge Instructions (Signed)
Asthma Attack Prevention °Although there is no way to prevent asthma from starting, you can take steps to control the disease and reduce its symptoms. Learn about your asthma and how to control it. Take an active role to control your asthma by working with your health care provider to create and follow an asthma action plan. An asthma action plan guides you in: °· Taking your medicines properly. °· Avoiding things that set off your asthma or make your asthma worse (asthma triggers). °· Tracking your level of asthma control. °· Responding to worsening asthma. °· Seeking emergency care when needed. °To track your asthma, keep records of your symptoms, check your peak flow number using a handheld device that shows how well air moves out of your lungs (peak flow meter), and get regular asthma checkups.  °WHAT ARE SOME WAYS TO PREVENT AN ASTHMA ATTACK? °· Take medicines as directed by your health care provider. °· Keep track of your asthma symptoms and level of control. °· With your health care provider, write a detailed plan for taking medicines and managing an asthma attack. Then be sure to follow your action plan. Asthma is an ongoing condition that needs regular monitoring and treatment. °· Identify and avoid asthma triggers. Many outdoor allergens and irritants (such as pollen, mold, cold air, and air pollution) can trigger asthma attacks. Find out what your asthma triggers are and take steps to avoid them. °· Monitor your breathing. Learn to recognize warning signs of an attack, such as coughing, wheezing, or shortness of breath. Your lung function may decrease before you notice any signs or symptoms, so regularly measure and record your peak airflow with a home peak flow meter. °· Identify and treat attacks early. If you act quickly, you are less likely to have a severe attack. You will also need less medicine to control your symptoms. When your peak flow measurements decrease and alert you to an upcoming attack,  take your medicine as instructed and immediately stop any activity that may have triggered the attack. If your symptoms do not improve, get medical help. °· Pay attention to increasing quick-relief inhaler use. If you find yourself relying on your quick-relief inhaler, your asthma is not under control. See your health care provider about adjusting your treatment. °WHAT CAN MAKE MY SYMPTOMS WORSE? °A number of common things can set off or make your asthma symptoms worse and cause temporary increased inflammation of your airways. Keep track of your asthma symptoms for several weeks, detailing all the environmental and emotional factors that are linked with your asthma. When you have an asthma attack, go back to your asthma diary to see which factor, or combination of factors, might have contributed to it. Once you know what these factors are, you can take steps to control many of them. If you have allergies and asthma, it is important to take asthma prevention steps at home. Minimizing contact with the substance to which you are allergic will help prevent an asthma attack. Some triggers and ways to avoid these triggers are: °Animal Dander:  °Some people are allergic to the flakes of skin or dried saliva from animals with fur or feathers.  °· There is no such thing as a hypoallergenic dog or cat breed. All dogs or cats can cause allergies, even if they don't shed. °· Keep these pets out of your home. °· If you are not able to keep a pet outdoors, keep the pet out of your bedroom and other sleeping areas at all   times, and keep the door closed. °· Remove carpets and furniture covered with cloth from your home. If that is not possible, keep the pet away from fabric-covered furniture and carpets. °Dust Mites: °Many people with asthma are allergic to dust mites. Dust mites are tiny bugs that are found in every home in mattresses, pillows, carpets, fabric-covered furniture, bedcovers, clothes, stuffed toys, and other  fabric-covered items.  °· Cover your mattress in a special dust-proof cover. °· Cover your pillow in a special dust-proof cover, or wash the pillow each week in hot water. Water must be hotter than 130° F (54.4° C) to kill dust mites. Cold or warm water used with detergent and bleach can also be effective. °· Wash the sheets and blankets on your bed each week in hot water. °· Try not to sleep or lie on cloth-covered cushions. °· Call ahead when traveling and ask for a smoke-free hotel room. Bring your own bedding and pillows in case the hotel only supplies feather pillows and down comforters, which may contain dust mites and cause asthma symptoms. °· Remove carpets from your bedroom and those laid on concrete, if you can. °· Keep stuffed toys out of the bed, or wash the toys weekly in hot water or cooler water with detergent and bleach. °Cockroaches: °Many people with asthma are allergic to the droppings and remains of cockroaches.  °· Keep food and garbage in closed containers. Never leave food out. °· Use poison baits, traps, powders, gels, or paste (for example, boric acid). °· If a spray is used to kill cockroaches, stay out of the room until the odor goes away. °Indoor Mold: °· Fix leaky faucets, pipes, or other sources of water that have mold around them. °· Clean floors and moldy surfaces with a fungicide or diluted bleach. °· Avoid using humidifiers, vaporizers, or swamp coolers. These can spread molds through the air. °Pollen and Outdoor Mold: °· When pollen or mold spore counts are high, try to keep your windows closed. °· Stay indoors with windows closed from late morning to afternoon. Pollen and some mold spore counts are highest at that time. °· Ask your health care provider whether you need to take anti-inflammatory medicine or increase your dose of the medicine before your allergy season starts. °Other Irritants to Avoid: °· Tobacco smoke is an irritant. If you smoke, ask your health care provider how  you can quit. Ask family members to quit smoking, too. Do not allow smoking in your home or car. °· If possible, do not use a wood-burning stove, kerosene heater, or fireplace. Minimize exposure to all sources of smoke, including incense, candles, fires, and fireworks. °· Try to stay away from strong odors and sprays, such as perfume, talcum powder, hair spray, and paints. °· Decrease humidity in your home and use an indoor air cleaning device. Reduce indoor humidity to below 60%. Dehumidifiers or central air conditioners can do this. °· Decrease house dust exposure by changing furnace and air cooler filters frequently. °· Try to have someone else vacuum for you once or twice a week. Stay out of rooms while they are being vacuumed and for a short while afterward. °· If you vacuum, use a dust mask from a hardware store, a double-layered or microfilter vacuum cleaner bag, or a vacuum cleaner with a HEPA filter. °· Sulfites in foods and beverages can be irritants. Do not drink beer or wine or eat dried fruit, processed potatoes, or shrimp if they cause asthma symptoms. °· Cold   air can trigger an asthma attack. Cover your nose and mouth with a scarf on cold or windy days. °· Several health conditions can make asthma more difficult to manage, including a runny nose, sinus infections, reflux disease, psychological stress, and sleep apnea. Work with your health care provider to manage these conditions. °· Avoid close contact with people who have a respiratory infection such as a cold or the flu, since your asthma symptoms may get worse if you catch the infection. Wash your hands thoroughly after touching items that may have been handled by people with a respiratory infection. °· Get a flu shot every year to protect against the flu virus, which often makes asthma worse for days or weeks. Also get a pneumonia shot if you have not previously had one. Unlike the flu shot, the pneumonia shot does not need to be given  yearly. °Medicines: °· Talk to your health care provider about whether it is safe for you to take aspirin or non-steroidal anti-inflammatory medicines (NSAIDs). In a small number of people with asthma, aspirin and NSAIDs can cause asthma attacks. These medicines must be avoided by people who have known aspirin-sensitive asthma. It is important that people with aspirin-sensitive asthma read labels of all over-the-counter medicines used to treat pain, colds, coughs, and fever. °· Beta-blockers and ACE inhibitors are other medicines you should discuss with your health care provider. °HOW CAN I FIND OUT WHAT I AM ALLERGIC TO? °Ask your asthma health care provider about allergy skin testing or blood testing (the RAST test) to identify the allergens to which you are sensitive. If you are found to have allergies, the most important thing to do is to try to avoid exposure to any allergens that you are sensitive to as much as possible. Other treatments for allergies, such as medicines and allergy shots (immunotherapy) are available.  °CAN I EXERCISE? °Follow your health care provider's advice regarding asthma treatment before exercising. It is important to maintain a regular exercise program, but vigorous exercise or exercise in cold, humid, or dry environments can cause asthma attacks, especially for those people who have exercise-induced asthma. °Document Released: 11/29/2009 Document Revised: 12/16/2013 Document Reviewed: 06/18/2013 °Exit[employer]® Patient Information ©2015 [employer]. This information is not intended to replace advice given to you by your health care provider. Make sure you discuss any questions you have with your health care provider. ° °Asthma °Asthma is a recurring condition in which the airways tighten and narrow. Asthma can make it difficult to breathe. It can cause coughing, wheezing, and shortness of breath. Asthma episodes, also called asthma attacks, range from minor to life-threatening. Asthma  cannot be cured, but medicines and lifestyle changes can help control it. °CAUSES °Asthma is believed to be caused by inherited (genetic) and environmental factors, but its exact cause is unknown. Asthma may be triggered by allergens, lung infections, or irritants in the air. Asthma triggers are different for each person. Common triggers include:  °· Animal dander. °· Dust mites. °· Cockroaches. °· Pollen from trees or grass. °· Mold. °· Smoke. °· Air pollutants such as dust, household cleaners, hair sprays, aerosol sprays, paint fumes, strong chemicals, or strong odors. °· Cold air, weather changes, and winds (which increase molds and pollens in the air). °· Strong emotional expressions such as crying or laughing hard. °· Stress. °· Certain medicines (such as aspirin) or types of drugs (such as beta-blockers). °· Sulfites in foods and drinks. Foods and drinks that may contain sulfites include dried fruit, potato   chips, and sparkling grape juice. °· Infections or inflammatory conditions such as the flu, a cold, or an inflammation of the nasal membranes (rhinitis). °· Gastroesophageal reflux disease (GERD). °· Exercise or strenuous activity. °SYMPTOMS °Symptoms may occur immediately after asthma is triggered or many hours later. Symptoms include: °· Wheezing. °· Excessive nighttime or early morning coughing. °· Frequent or severe coughing with a common cold. °· Chest tightness. °· Shortness of breath. °DIAGNOSIS  °The diagnosis of asthma is made by a review of your medical history and a physical exam. Tests may also be performed. These may include: °· Lung function studies. These tests show how much air you breathe in and out. °· Allergy tests. °· Imaging tests such as X-rays. °TREATMENT  °Asthma cannot be cured, but it can usually be controlled. Treatment involves identifying and avoiding your asthma triggers. It also involves medicines. There are 2 classes of medicine used for asthma treatment:  °· Controller  medicines. These prevent asthma symptoms from occurring. They are usually taken every day. °· Reliever or rescue medicines. These quickly relieve asthma symptoms. They are used as needed and provide short-term relief. °Your health care provider will help you create an asthma action plan. An asthma action plan is a written plan for managing and treating your asthma attacks. It includes a list of your asthma triggers and how they may be avoided. It also includes information on when medicines should be taken and when their dosage should be changed. An action plan may also involve the use of a device called a peak flow meter. A peak flow meter measures how well the lungs are working. It helps you monitor your condition. °HOME CARE INSTRUCTIONS  °· Take medicines only as directed by your health care provider. Speak with your health care provider if you have questions about how or when to take the medicines. °· Use a peak flow meter as directed by your health care provider. Record and keep track of readings. °· Understand and use the action plan to help minimize or stop an asthma attack without needing to seek medical care. °· Control your home environment in the following ways to help prevent asthma attacks: °¨ Do not smoke. Avoid being exposed to secondhand smoke. °¨ Change your heating and air conditioning filter regularly. °¨ Limit your use of fireplaces and wood stoves. °¨ Get rid of pests (such as roaches and mice) and their droppings. °¨ Throw away plants if you see mold on them. °¨ Clean your floors and dust regularly. Use unscented cleaning products. °¨ Try to have someone else vacuum for you regularly. Stay out of rooms while they are being vacuumed and for a short while afterward. If you vacuum, use a dust mask from a hardware store, a double-layered or microfilter vacuum cleaner bag, or a vacuum cleaner with a HEPA filter. °¨ Replace carpet with wood, tile, or vinyl flooring. Carpet can trap dander and  dust. °¨ Use allergy-proof pillows, mattress covers, and box spring covers. °¨ Wash bed sheets and blankets every week in hot water and dry them in a dryer. °¨ Use blankets that are made of polyester or cotton. °¨ Clean bathrooms and kitchens with bleach. If possible, have someone repaint the walls in these rooms with mold-resistant paint. Keep out of the rooms that are being cleaned and painted. °¨ Wash hands frequently. °SEEK MEDICAL CARE IF:  °· You have wheezing, shortness of breath, or a cough even if taking medicine to prevent attacks. °· The colored mucus   you cough up (sputum) is thicker than usual. °· Your sputum changes from clear or white to yellow, green, gray, or bloody. °· You have any problems that may be related to the medicines you are taking (such as a rash, itching, swelling, or trouble breathing). °· You are using a reliever medicine more than 2-3 times per week. °· Your peak flow is still at 50-79% of your personal best after following your action plan for 1 hour. °· You have a fever. °SEEK IMMEDIATE MEDICAL CARE IF:  °· You seem to be getting worse and are unresponsive to treatment during an asthma attack. °· You are short of breath even at rest. °· You get short of breath when doing very little physical activity. °· You have difficulty eating, drinking, or talking due to asthma symptoms. °· You develop chest pain. °· You develop a fast heartbeat. °· You have a bluish color to your lips or fingernails. °· You are light-headed, dizzy, or faint. °· Your peak flow is less than 50% of your personal best. °MAKE SURE YOU:  °· Understand these instructions. °· Will watch your condition. °· Will get help right away if you are not doing well or get worse. °Document Released: 12/11/2005 Document Revised: 04/27/2014 Document Reviewed: 07/10/2013 °[employer]® Patient Information ©2015 [employer]. This information is not intended to replace advice given to you by your health care provider. Make sure you  discuss any questions you have with your health care provider. ° °

## 2015-06-20 NOTE — Care Management Note (Signed)
Case Management Note  Patient Details  Name: Alexis Hodges MRN: [id number] Date of Birth: 08-13-1976  Subjective/Objective:                  shortness of breath, wheezing   Action/Plan: Discharge Planning  Expected Discharge Date:   (unknown)               Expected Discharge Plan:  Home/Self Care  In-House Referral:     Discharge planning Services  CM Consult  Post Acute Care Choice:    Choice offered to:     DME Arranged:  Nebulizer machine DME Agency:  [facility]  HH Arranged:  NA HH Agency:     Status of Service:  Completed, signed off  Medicare Important Message Given:    Date Medicare IM Given:    Medicare IM give by:    Date Additional Medicare IM Given:    Additional Medicare Important Message give by:     If discussed at Long Length of Stay Meetings, dates discussed:    Additional Comments:  CM spoke with patient at the bedside. James at [facility] notified of Nebulizer request and discharge date. [facility] will deliver the nebulizer to the patient's room prior to discharge. No other CM needs. Antony Haste, RN 06/20/2015, 9:55 AM

## 2015-06-20 NOTE — Discharge Summary (Signed)
Physician Discharge Summary  Alexis Hodges WJX:[id number] DOB: [birth year] DOA: 06/18/2015  PCP: Oliver Barre, MD  Admit date: 06/18/2015 Discharge date: 06/20/2015  Recommendations for Outpatient Follow-up:  1. Prescription provided for [facility] and albuterol nebulizer  Taper down prednisone starting from 50 mg a day, taper down by 5 mg a day down to 0 mg. for ex, today 50 mg, tomorrow 45 mg, then 40 mg the following day and etc...   Discharge Diagnoses:  Principal Problem:   Acute respiratory failure with hypoxia Active Problems:   Asthma exacerbation    Discharge Condition: stable   Diet recommendation: as tolerated   History of present illness:   39 year old female with past medical history of asthma, trichotillomania who presented to [facility] ED with worsening shortness of breath for past couple of hours prior to this admission. She was given nebulizer treatment in ED along with solumedrol 125 mg IV but continued to have hypoxia especially with ambulation. She was admitted for asthma exacerbation management.   Hospital Course:    Assessment/Plan:    Principal Problem: Acute respiratory failure with hypoxia / Asthma exacerbation - Much better respiratory status this am - D/C with dulera Q 12 hours and albuterol nebulizer and inhaler as needed - Prednisone taper on discharge as mentioned above     DVT prophylaxis:  - SCD's bilaterally     Code Status: Full.  Family Communication: plan of care discussed with the patient    IV access:  Peripheral IV  Procedures and diagnostic studies:   Dg Chest Portable 1 View 06/18/2015 Findings which are felt to be indicative of a degree of chronic bronchitis. No edema or consolidation.   Medical Consultants:  None   Other Consultants:  None   IAnti-Infectives:   None      Signed:  Manson Passey, MD  [facility] 06/20/2015, 8:58 AM  Pager #: [phone]  Time spent in minutes: less  than 30 minutes   Discharge Exam: Filed Vitals:   06/20/15 0554  BP: 135/76  Pulse: 79  Temp: 98.1 F (36.7 C)  Resp: 18   Filed Vitals:   06/19/15 2130 06/20/15 0055 06/20/15 0554 06/20/15 0751  BP: 133/92  135/76   Pulse: 91  79   Temp: 98.1 F (36.7 C)  98.1 F (36.7 C)   TempSrc: Oral  Oral   Resp: 18  18   Height:      Weight:      SpO2: 95% 98% 99% 97%    General: Pt is alert, follows commands appropriately, not in acute distress Cardiovascular: Regular rate and rhythm, S1/S2 +, no murmurs Respiratory: Clear to auscultation bilaterally, no wheezing, no crackles, no rhonchi Abdominal: Soft, non tender, non distended, bowel sounds +, no guarding Extremities: no edema, no cyanosis, pulses palpable bilaterally DP and PT Neuro: Grossly nonfocal  Discharge Instructions  Discharge Instructions    Call MD for:  difficulty breathing, headache or visual disturbances    Complete by:  As directed      Call MD for:  persistant nausea and vomiting    Complete by:  As directed      Call MD for:  redness, tenderness, or signs of infection (pain, swelling, redness, odor or green/yellow discharge around incision site)    Complete by:  As directed      Diet - low sodium heart healthy    Complete by:  As directed      Increase activity slowly    Complete  by:  As directed             Medication List    STOP taking these medications        amoxicillin-clavulanate 875-125 MG per tablet  Commonly known as:  AUGMENTIN     fexofenadine 180 MG tablet  Commonly known as:  ALLEGRA      TAKE these medications        albuterol 108 (90 BASE) MCG/ACT inhaler  Commonly known as:  PROAIR HFA  Inhale 2 puffs into the lungs every 4 (four) hours as needed for wheezing or shortness of breath.     albuterol (2.5 MG/3ML) 0.083% nebulizer solution  Commonly known as:  PROVENTIL  Take 3 mLs (2.5 mg total) by nebulization every 6 (six) hours as needed for wheezing or shortness of  breath.     BRONKAID 25-400 MG Tabs  Generic drug:  Ephedrine-Guaifenesin  Take 1 tablet by mouth 2 (two) times daily as needed (for cough).     diphenhydrAMINE 25 MG tablet  Commonly known as:  BENADRYL  Take 25 mg by mouth every 6 (six) hours as needed.     EQ VAPORIZING RUB 4.8-1.2-2.6 % Oint  Apply 1 application topically as needed (for congestion).     fluticasone 50 MCG/ACT nasal spray  Commonly known as:  FLONASE  1-2 sprays each nostril at bedtime     guaiFENesin 600 MG 12 hr tablet  Commonly known as:  MUCINEX  Take 1,200 mg by mouth 2 (two) times daily as needed for cough or to loosen phlegm.     ipratropium 0.03 % nasal spray  Commonly known as:  ATROVENT  Place 2 sprays into both nostrils every 12 (twelve) hours.     mometasone-formoterol 100-5 MCG/ACT Aero  Commonly known as:  DULERA  Inhale 2 puffs into the lungs every 12 (twelve) hours.     montelukast 10 MG tablet  Commonly known as:  SINGULAIR  Take 1 tablet (10 mg total) by mouth at bedtime.     predniSONE 5 MG tablet  Commonly known as:  DELTASONE  Take 10 tablets (50 mg total) by mouth daily with breakfast.     Umeclidinium-Vilanterol 62.5-25 MCG/INH Aepb  Commonly known as:  ANORO ELLIPTA  Inhale 1 puff into the lungs daily.            Follow-up Information    Follow up with Oliver Barre, MD. Schedule an appointment as soon as possible for a visit in 1 week.   Specialties:  Internal Medicine, Radiology   Why:  Follow up appt after recent hospitalization   Contact information:   [address] [facility] [place] [address] [phone]        The results of significant diagnostics from this hospitalization (including imaging, microbiology, ancillary and laboratory) are listed below for reference.    Significant Diagnostic Studies: Dg Chest Portable 1 View  06/18/2015   CLINICAL DATA:  One day history of progressive shortness of breath. Cough  EXAM: PORTABLE CHEST - 1 VIEW  COMPARISON:   May 19, 2015  FINDINGS: Mild central interstitial thickening consistent with a degree of bronchitis is stable. There is no frank edema or consolidation. The lungs are borderline hyperexpanded. Heart size and pulmonary vascularity are normal. No adenopathy.  IMPRESSION: Findings which are felt to be indicative of a degree of chronic bronchitis. No edema or consolidation.   Electronically Signed   By: Bretta Bang III M.D.   On: 06/18/2015  09:52    Microbiology: No results found for this or any previous visit (from the past 240 hour(s)).   Labs: Basic Metabolic Panel:  Recent Labs Lab 06/18/15 1004 06/19/15 0519  NA 139 139  K 3.8 3.9  CL 109 107  CO2 20* 21*  GLUCOSE 112* 134*  BUN 9 8  CREATININE 0.78 0.80  CALCIUM 8.7* 9.4   Liver Function Tests:  Recent Labs Lab 06/19/15 0519  AST 16  ALT 15  ALKPHOS 62  BILITOT 0.6  PROT 7.5  ALBUMIN 4.4   No results for input(s): LIPASE, AMYLASE in the last 168 hours. No results for input(s): AMMONIA in the last 168 hours. CBC:  Recent Labs Lab 06/18/15 1004 06/19/15 0519  WBC 8.3 14.1*  NEUTROABS 5.8  --   HGB 15.1* 14.4  HCT 43.6 42.1  MCV 100.2* 101.0*  PLT 258 284   Cardiac Enzymes: No results for input(s): CKTOTAL, CKMB, CKMBINDEX, TROPONINI in the last 168 hours. BNP: BNP (last 3 results) No results for input(s): BNP in the last 8760 hours.  ProBNP (last 3 results) No results for input(s): PROBNP in the last 8760 hours.  CBG:  Recent Labs Lab 06/19/15 0724 06/20/15 0735  GLUCAP 140* 121*

## 2015-06-20 NOTE — Progress Notes (Signed)
Patient ambulated in hall on room air with Crystal, case management, [phone].  Sats 93%.  Did not qualify for home oxygen.

## 2015-06-21 LAB — BLOOD GAS, ARTERIAL
ACID-BASE DEFICIT: 2.3 mmol/L — AB (ref 0.0–2.0)
Bicarbonate: 20.9 mEq/L (ref 20.0–24.0)
Drawn by: 270211
O2 Content: 3 L/min
O2 Saturation: 92.9 %
PATIENT TEMPERATURE: 98.6
PCO2 ART: 33.2 mmHg — AB (ref 35.0–45.0)
PH ART: 7.415 (ref 7.350–7.450)
TCO2: 18.1 mmol/L (ref 0–100)
pO2, Arterial: 66.2 mmHg — ABNORMAL LOW (ref 80.0–100.0)

## 2015-06-23 ENCOUNTER — Ambulatory Visit (INDEPENDENT_AMBULATORY_CARE_PROVIDER_SITE_OTHER): Payer: Managed Care, Other (non HMO) | Admitting: Internal Medicine

## 2015-06-23 ENCOUNTER — Encounter: Payer: Self-pay | Admitting: Internal Medicine

## 2015-06-23 VITALS — BP 110/62 | HR 83 | Temp 98.4°F | Ht 64.0 in | Wt 153.0 lb

## 2015-06-23 DIAGNOSIS — J45901 Unspecified asthma with (acute) exacerbation: Secondary | ICD-10-CM

## 2015-06-23 NOTE — Progress Notes (Signed)
Pre visit review using our clinic review tool, if applicable. No additional management support is needed unless otherwise documented below in the visit note. 

## 2015-06-23 NOTE — Assessment & Plan Note (Signed)
Resolved, to finish current prednisone, cont maint meds, f/u allergy as scheduled

## 2015-06-23 NOTE — Patient Instructions (Signed)
Please continue all other medications as before, and refills have been done if requested.  Please have the pharmacy call with any other refills you may need.  Please continue your efforts at being more active, low cholesterol diet, and weight control.  Please keep your appointments with your specialists as you may have planned     

## 2015-06-23 NOTE — Progress Notes (Signed)
Subjective:    Patient ID: Alexis Hodges, female    DOB: [birth year], 39 y.o.   MRN: [id number]  HPI  Here to f/u recent hospon for asthma exac, d/c June 26 on dulera and alb neb, prednisone taper.  Pt denies chest pain, increased sob or doe, wheezing, orthopnea, PND, increased LE swelling, palpitations, dizziness or syncope.  Pt denies new neurological symptoms such as new headache, or facial or extremity weakness or numbness   Past Medical History  Diagnosis Date  . Asthma   . Allergic rhinitis, cause unspecified   . Anxiety   . Migraine headache    No past surgical history on file.  reports that she has quit smoking. Her smoking use included Cigarettes. She has a 3 pack-year smoking history. She quit smokeless tobacco use about 2 weeks ago. She reports that she drinks alcohol. She reports that she does not use illicit drugs. family history is not on file. Allergies  Allergen Reactions  . Neomycin-Bacitracin Zn-Polymyx Other (See Comments)    Breaks out in "exczema like bumps"   Current Outpatient Prescriptions on File Prior to Visit  Medication Sig Dispense Refill  . albuterol (PROAIR HFA) 108 (90 BASE) MCG/ACT inhaler Inhale 2 puffs into the lungs every 4 (four) hours as needed for wheezing or shortness of breath. 3 Inhaler 4  . albuterol (PROVENTIL) (2.5 MG/3ML) 0.083% nebulizer solution Take 3 mLs (2.5 mg total) by nebulization every 6 (six) hours as needed for wheezing or shortness of breath. 75 mL 0  . diphenhydrAMINE (BENADRYL) 25 MG tablet Take 25 mg by mouth every 6 (six) hours as needed.    . fluticasone (FLONASE) 50 MCG/ACT nasal spray 1-2 sprays each nostril at bedtime 48 g 3  . guaiFENesin (MUCINEX) 600 MG 12 hr tablet Take 1,200 mg by mouth 2 (two) times daily as needed for cough or to loosen phlegm.    Marland Kitchen. ipratropium (ATROVENT) 0.03 % nasal spray Place 2 sprays into both nostrils every 12 (twelve) hours. 90 mL 3  . mometasone-formoterol (DULERA) 100-5 MCG/ACT AERO  Inhale 2 puffs into the lungs every 12 (twelve) hours. 1 Inhaler 0  . montelukast (SINGULAIR) 10 MG tablet Take 1 tablet (10 mg total) by mouth at bedtime. 90 tablet 3  . predniSONE (DELTASONE) 5 MG tablet Take 10 tablets (50 mg total) by mouth daily with breakfast. 55 tablet 0  . Camphor-Eucalyptus-Menthol (EQ VAPORIZING RUB) 4.8-1.2-2.6 % OINT Apply 1 application topically as needed (for congestion).    Marland Kitchen. Ephedrine-Guaifenesin (BRONKAID) 25-400 MG TABS Take 1 tablet by mouth 2 (two) times daily as needed (for cough).    . Umeclidinium-Vilanterol (ANORO ELLIPTA) 62.5-25 MCG/INH AEPB Inhale 1 puff into the lungs daily. (Patient not taking: Reported on 06/23/2015) 60 each prn   No current facility-administered medications on file prior to visit.    Review of Systems All otherwise neg per pt     Objective:   Physical Exam BP 110/62 mmHg  Pulse 83  Temp(Src) 98.4 F (36.9 C) (Oral)  Ht 5\' 4"  (1.626 m)  Wt 153 lb (69.4 kg)  BMI 26.25 kg/m2  SpO2 95%  LMP 06/19/2015 VS noted,  Constitutional: Pt appears in no significant distress HENT: Head: NCAT.  Right Ear: External ear normal.  Left Ear: External ear normal.  Eyes: . Pupils are equal, round, and reactive to light. Conjunctivae and EOM are normal Neck: Normal range of motion. Neck supple.  Cardiovascular: Normal rate and regular rhythm.   Pulmonary/Chest: Effort  normal and breath sounds without rales or wheezing. except for trace exp wheezes bilat Neurological: Pt is alert. Not confused , motor grossly intact Skin: Skin is warm. No rash, no LE edema Psychiatric: Pt behavior is normal. No agitation.     Assessment & Plan:

## 2016-07-17 IMAGING — CR DG CHEST 1V PORT
1 series · 1 of 1 positions shown · non-contrast
Comparison: May 19, 2015

CLINICAL DATA: One day history of progressive shortness of breath.
Cough

EXAM:
PORTABLE CHEST - 1 VIEW

[AP]
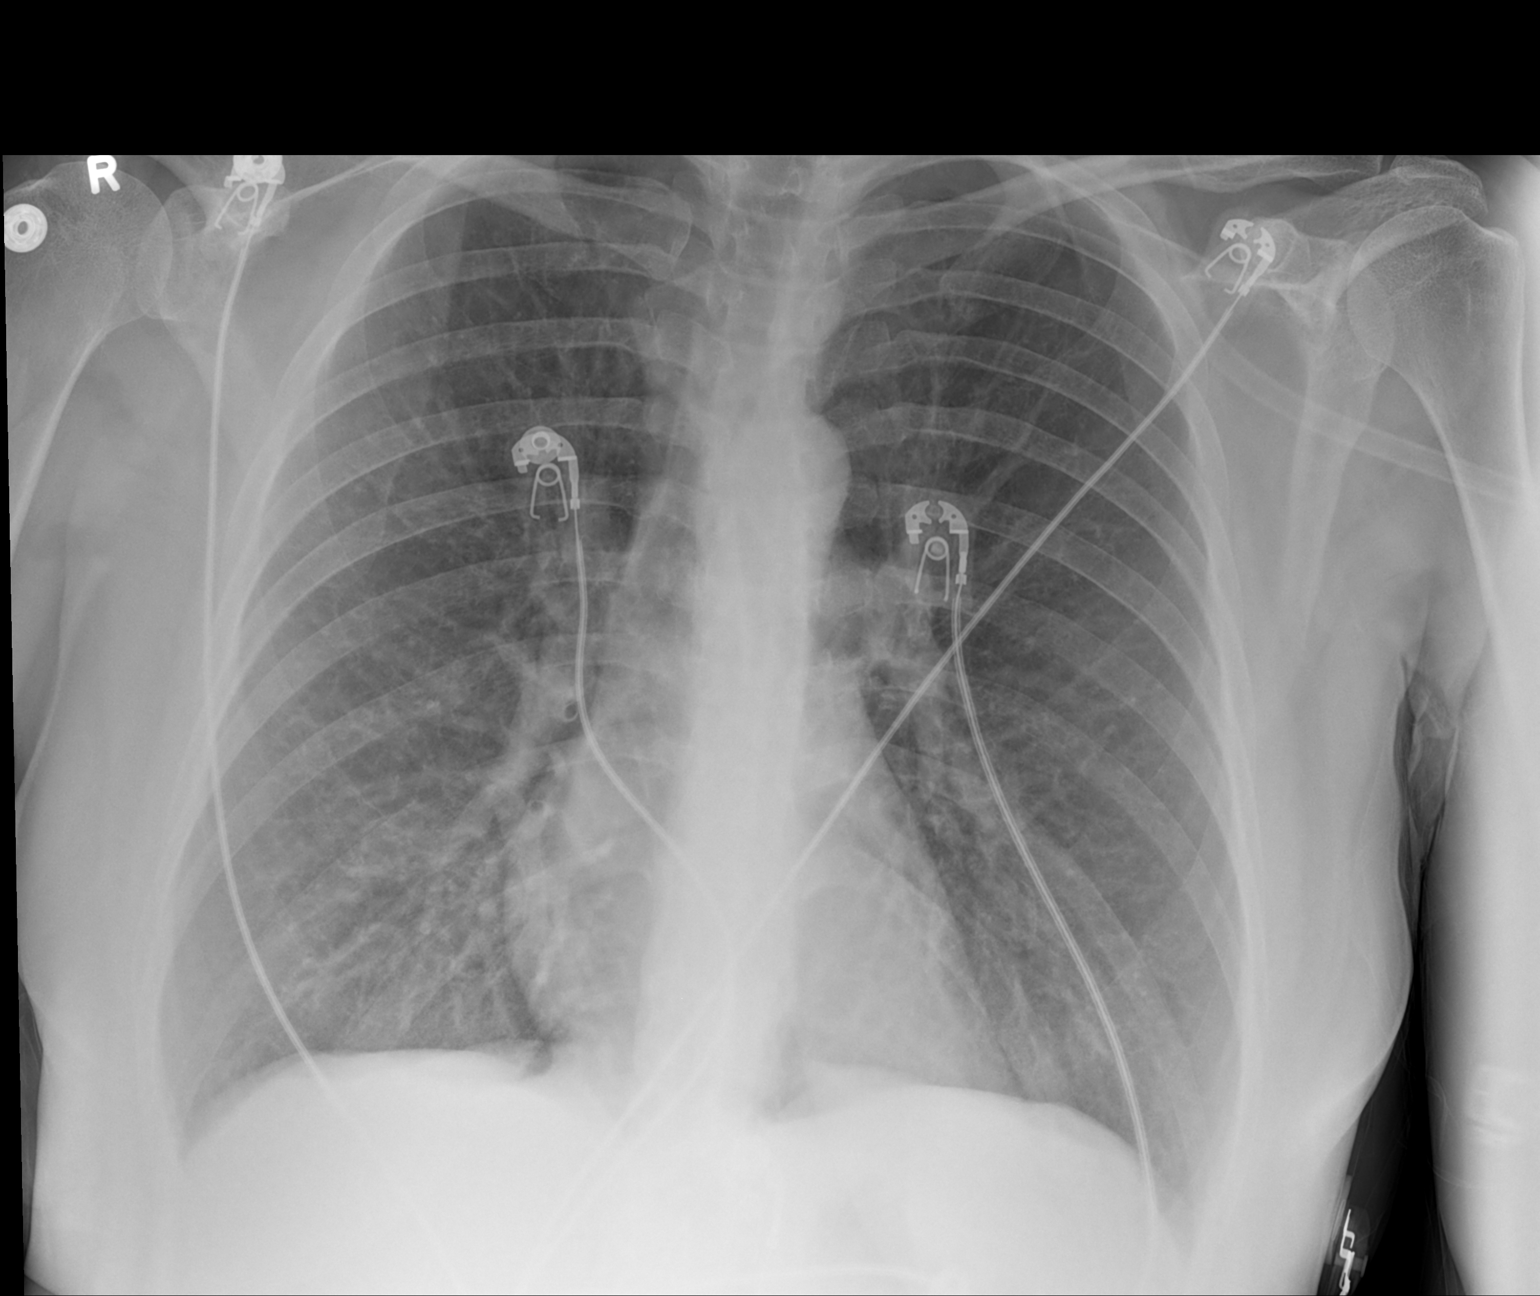

[1 of 1 positions shown; findings below may reference images not displayed]

FINDINGS: Mild central interstitial thickening consistent with a degree of
bronchitis is stable. There is no frank edema or consolidation. The
lungs are borderline hyperexpanded. Heart size and pulmonary
vascularity are normal. No adenopathy.
IMPRESSION: Findings which are felt to be indicative of a degree of chronic
bronchitis. No edema or consolidation.
